# Patient Record
Sex: Female | Born: 1955 | Race: White | Hispanic: No | Marital: Married | Smoking: Never smoker
Health system: Southern US, Community
[De-identification: ages and names within clinical notes are randomized; demographics above are authoritative.]

## PROBLEM LIST (undated history)

## (undated) DIAGNOSIS — E1122 Type 2 diabetes mellitus with diabetic chronic kidney disease: Secondary | ICD-10-CM

## (undated) DIAGNOSIS — G35 Multiple sclerosis: Secondary | ICD-10-CM

## (undated) DIAGNOSIS — I129 Hypertensive chronic kidney disease with stage 1 through stage 4 chronic kidney disease, or unspecified chronic kidney disease: Secondary | ICD-10-CM

## (undated) DIAGNOSIS — I10 Essential (primary) hypertension: Secondary | ICD-10-CM

## (undated) DIAGNOSIS — E079 Disorder of thyroid, unspecified: Secondary | ICD-10-CM

## (undated) DIAGNOSIS — N182 Chronic kidney disease, stage 2 (mild): Secondary | ICD-10-CM

## (undated) DIAGNOSIS — E785 Hyperlipidemia, unspecified: Secondary | ICD-10-CM

## (undated) DIAGNOSIS — K529 Noninfective gastroenteritis and colitis, unspecified: Secondary | ICD-10-CM

## (undated) DIAGNOSIS — K219 Gastro-esophageal reflux disease without esophagitis: Secondary | ICD-10-CM

## (undated) DIAGNOSIS — E119 Type 2 diabetes mellitus without complications: Secondary | ICD-10-CM

## (undated) HISTORY — DX: Noninfective gastroenteritis and colitis, unspecified: K52.9

## (undated) HISTORY — DX: Gastro-esophageal reflux disease without esophagitis: K21.9

## (undated) HISTORY — DX: Hyperlipidemia, unspecified: E78.5

## (undated) HISTORY — PX: COLONOSCOPY: SHX174

## (undated) HISTORY — DX: Hypertensive chronic kidney disease with stage 1 through stage 4 chronic kidney disease, or unspecified chronic kidney disease: I12.9

## (undated) HISTORY — DX: Disorder of thyroid, unspecified: E07.9

## (undated) HISTORY — DX: Multiple sclerosis: G35

## (undated) HISTORY — DX: Chronic kidney disease, stage 2 (mild): N18.2

## (undated) HISTORY — DX: Essential (primary) hypertension: I10

## (undated) HISTORY — PX: WISDOM TOOTH EXTRACTION: SHX21

## (undated) HISTORY — DX: Type 2 diabetes mellitus with diabetic chronic kidney disease: E11.22

## (undated) HISTORY — DX: Type 2 diabetes mellitus without complications: E11.9

---

## 1997-06-23 ENCOUNTER — Other Ambulatory Visit: Admission: RE | Admit: 1997-06-23 | Discharge: 1997-06-23 | Payer: Self-pay | Admitting: Obstetrics & Gynecology

## 1997-08-16 ENCOUNTER — Other Ambulatory Visit: Admission: RE | Admit: 1997-08-16 | Discharge: 1997-08-16 | Payer: Self-pay | Admitting: *Deleted

## 1998-03-03 ENCOUNTER — Other Ambulatory Visit: Admission: RE | Admit: 1998-03-03 | Discharge: 1998-03-03 | Payer: Self-pay | Admitting: Obstetrics & Gynecology

## 1999-03-07 ENCOUNTER — Other Ambulatory Visit: Admission: RE | Admit: 1999-03-07 | Discharge: 1999-03-07 | Payer: Self-pay | Admitting: Obstetrics & Gynecology

## 2000-03-11 ENCOUNTER — Other Ambulatory Visit: Admission: RE | Admit: 2000-03-11 | Discharge: 2000-03-11 | Payer: Self-pay | Admitting: Obstetrics & Gynecology

## 2001-05-09 ENCOUNTER — Other Ambulatory Visit: Admission: RE | Admit: 2001-05-09 | Discharge: 2001-05-09 | Payer: Self-pay | Admitting: Obstetrics & Gynecology

## 2003-04-22 ENCOUNTER — Other Ambulatory Visit: Admission: RE | Admit: 2003-04-22 | Discharge: 2003-04-22 | Payer: Self-pay | Admitting: Obstetrics & Gynecology

## 2004-07-06 ENCOUNTER — Other Ambulatory Visit: Admission: RE | Admit: 2004-07-06 | Discharge: 2004-07-06 | Payer: Self-pay | Admitting: Obstetrics & Gynecology

## 2007-11-06 ENCOUNTER — Ambulatory Visit: Payer: Self-pay | Admitting: Internal Medicine

## 2007-11-21 ENCOUNTER — Ambulatory Visit: Payer: Self-pay | Admitting: Internal Medicine

## 2008-05-11 ENCOUNTER — Encounter: Payer: Self-pay | Admitting: Internal Medicine

## 2008-05-26 DIAGNOSIS — K921 Melena: Secondary | ICD-10-CM

## 2008-05-26 DIAGNOSIS — I10 Essential (primary) hypertension: Secondary | ICD-10-CM | POA: Insufficient documentation

## 2008-05-26 DIAGNOSIS — K449 Diaphragmatic hernia without obstruction or gangrene: Secondary | ICD-10-CM | POA: Insufficient documentation

## 2008-05-26 DIAGNOSIS — R079 Chest pain, unspecified: Secondary | ICD-10-CM | POA: Insufficient documentation

## 2008-05-26 DIAGNOSIS — G35 Multiple sclerosis: Secondary | ICD-10-CM

## 2008-05-26 DIAGNOSIS — K5909 Other constipation: Secondary | ICD-10-CM

## 2008-05-26 DIAGNOSIS — R109 Unspecified abdominal pain: Secondary | ICD-10-CM | POA: Insufficient documentation

## 2008-05-31 ENCOUNTER — Ambulatory Visit: Payer: Self-pay | Admitting: Internal Medicine

## 2008-06-07 ENCOUNTER — Ambulatory Visit (HOSPITAL_COMMUNITY): Admission: RE | Admit: 2008-06-07 | Discharge: 2008-06-07 | Payer: Self-pay | Admitting: Internal Medicine

## 2008-06-08 ENCOUNTER — Encounter: Payer: Self-pay | Admitting: Internal Medicine

## 2008-06-08 ENCOUNTER — Ambulatory Visit: Payer: Self-pay | Admitting: Internal Medicine

## 2008-06-09 ENCOUNTER — Encounter: Payer: Self-pay | Admitting: Internal Medicine

## 2008-08-02 ENCOUNTER — Encounter: Payer: Self-pay | Admitting: Internal Medicine

## 2010-11-13 ENCOUNTER — Other Ambulatory Visit: Payer: Self-pay | Admitting: Obstetrics & Gynecology

## 2012-08-19 ENCOUNTER — Telehealth: Payer: Self-pay | Admitting: *Deleted

## 2012-08-19 NOTE — Telephone Encounter (Signed)
Patient calling having some questions and concerns about getting live vaccines so that she can go out the country.  She wants to know if it is going to interfere with her meds that she getting from here.

## 2012-08-20 NOTE — Telephone Encounter (Signed)
So where is she going would decide which vaccines she should take,there is no contradiction that I am aware of with her current medications.

## 2012-09-02 ENCOUNTER — Telehealth: Payer: Self-pay

## 2012-09-02 NOTE — Telephone Encounter (Signed)
I called patient. The patient will be traveling to [place], and she needs a vaccination for yellow fever. There is no contraindication for getting a vaccination with the diagnosis of multiple sclerosis.

## 2012-09-02 NOTE — Telephone Encounter (Signed)
Patient called clinic and left message stating she is going to [place] and is concerned because she has MS.  She would like to know if there are any medications (or immunizations)  that should be prescribed before she goes.  Call back number [phone].  Thank you.

## 2012-09-19 ENCOUNTER — Encounter: Payer: Self-pay | Admitting: Nurse Practitioner

## 2012-09-19 ENCOUNTER — Ambulatory Visit (INDEPENDENT_AMBULATORY_CARE_PROVIDER_SITE_OTHER): Payer: PRIVATE HEALTH INSURANCE | Admitting: Nurse Practitioner

## 2012-09-19 VITALS — BP 122/78 | HR 75 | Ht 67.5 in | Wt 194.0 lb

## 2012-09-19 DIAGNOSIS — G35 Multiple sclerosis: Secondary | ICD-10-CM

## 2012-09-19 NOTE — Progress Notes (Signed)
HPI: Patient returns for followup after last visit 05/23/2012. She has a history of multiple sclerosis. She was on Betaseron but was tired of shots at her last visit and was switched to tecfidera. When she eats a full meal with the medication she has much less flushing and abdominal symptoms however if she eats a light meal her flushing is worse. Most recent MRI 05/29/2012 with previous MS lesions not seen.  She continues to have numbness which is intermittent on the top of the foot and she also has a pressure area over the third toe on the right with significant corns on the bottom of both feet. She denies any other sensory changes, focal weakness, speech or swallowing difficulty, double vision, blurred vision, balance issues   ROS:   swelling in legs, SOB, aching muscles,   Physical Exam General: well developed, well nourished, seated, in no evident distress Head: head normocephalic and atraumatic. Oropharynx benign Neck: supple with no carotid or supraclavicular bruits Cardiovascular: regular rate and rhythm, no murmurs Neurologic Exam Mental Status: Awake and fully alert. Oriented to place and time. Follows all commands. Mood and affect appropriate.  Cranial Nerves: Fundoscopic exam reveals sharp disc margins. Pupils equal, briskly reactive to light. Extraocular movements full without nystagmus. Visual fields full to confrontation. Hearing intact and symmetric to finger snap. Facial sensation intact. Face, tongue, palate move normally and symmetrically. Neck flexion and extension normal.  Motor: Normal bulk and tone. Normal strength in all tested extremity muscles. No focal weakness Sensory.: intact to touch and pinprick and vibratory in all extremities.  Coordination: Rapid alternating movements normal in all extremities. Finger-to-nose and heel-to-shin performed accurately bilaterally. Gait and Station: Arises from chair without difficulty. Stance is normal. Gait demonstrates normal stride  length and balance . Able to heel, toe and tandem walk without difficulty.  Reflexes: 2+ and symmetric. Toes downgoing.     ASSESSMENT: Multiple sclerosis currently on tecfidera     PLAN: Continue tecfidera her for now, she is to take the medication with a full meal twice a day If she needs to come off her tecfidera she wants to switch to something weekly. Reviewed CBC and CMP from primary care all within normal limits She will followup in 6 months  Nilda Riggs, GNP-BC APRN

## 2012-09-19 NOTE — Patient Instructions (Addendum)
Patient to continue tecfidera  for now Labs reviewed from primary care all within normal limits Followup in 6 months or sooner problems arise

## 2012-09-19 NOTE — Progress Notes (Signed)
I have read the note, and I agree with the clinical assessment and plan.  

## 2012-09-30 ENCOUNTER — Telehealth: Payer: Self-pay | Admitting: Nurse Practitioner

## 2012-09-30 NOTE — Telephone Encounter (Signed)
Patient is calling wanting to know if she can do something  other then Tecfidera. She knows it was her decision but does not want to do it any more. Cell # is  [phone]

## 2012-09-30 NOTE — Telephone Encounter (Signed)
TC to cell #, Unable to leave message. Called work #. Left message will switch to 1x week injection Avonex. Call back if she has concerns. A nursing visit will be ordered to teach new technique.

## 2013-01-16 ENCOUNTER — Other Ambulatory Visit (HOSPITAL_COMMUNITY): Payer: Self-pay | Admitting: Internal Medicine

## 2013-01-16 ENCOUNTER — Ambulatory Visit (HOSPITAL_COMMUNITY)
Admission: RE | Admit: 2013-01-16 | Discharge: 2013-01-16 | Disposition: A | Discharge status: Home / Self Care | Payer: PRIVATE HEALTH INSURANCE | Source: Ambulatory Visit | Attending: Internal Medicine | Admitting: Internal Medicine

## 2013-01-16 DIAGNOSIS — R52 Pain, unspecified: Secondary | ICD-10-CM

## 2013-01-16 DIAGNOSIS — R109 Unspecified abdominal pain: Secondary | ICD-10-CM | POA: Insufficient documentation

## 2013-01-16 DIAGNOSIS — Q619 Cystic kidney disease, unspecified: Secondary | ICD-10-CM | POA: Insufficient documentation

## 2013-01-27 ENCOUNTER — Other Ambulatory Visit: Payer: Self-pay | Admitting: Gastroenterology

## 2013-01-27 DIAGNOSIS — R110 Nausea: Secondary | ICD-10-CM

## 2013-01-27 DIAGNOSIS — R1013 Epigastric pain: Secondary | ICD-10-CM

## 2013-02-12 ENCOUNTER — Encounter (HOSPITAL_COMMUNITY)
Admission: RE | Admit: 2013-02-12 | Discharge: 2013-02-12 | Disposition: A | Discharge status: Home / Self Care | Payer: PRIVATE HEALTH INSURANCE | Source: Ambulatory Visit | Attending: Gastroenterology | Admitting: Gastroenterology

## 2013-02-12 DIAGNOSIS — R110 Nausea: Secondary | ICD-10-CM

## 2013-02-12 DIAGNOSIS — R1013 Epigastric pain: Secondary | ICD-10-CM

## 2013-02-12 MED ORDER — TECHNETIUM TC 99M MEBROFENIN IV KIT
5.0000 | PACK | Freq: Once | INTRAVENOUS | Status: AC | PRN
  Administered 2013-02-12: 5 via INTRAVENOUS

## 2013-02-12 MED ORDER — SINCALIDE 5 MCG IJ SOLR
INTRAMUSCULAR | Status: AC
  Administered 2013-02-12: 1.76 ug via INTRAVENOUS
  Filled 2013-02-12: qty 5

## 2013-02-12 MED ORDER — SINCALIDE 5 MCG IJ SOLR
0.0200 ug/kg | Freq: Once | INTRAMUSCULAR | Status: AC
  Administered 2013-02-12: 1.76 ug via INTRAVENOUS

## 2013-03-12 ENCOUNTER — Other Ambulatory Visit: Payer: Self-pay

## 2013-03-23 ENCOUNTER — Encounter: Payer: Self-pay | Admitting: Nurse Practitioner

## 2013-03-23 ENCOUNTER — Ambulatory Visit (INDEPENDENT_AMBULATORY_CARE_PROVIDER_SITE_OTHER): Payer: PRIVATE HEALTH INSURANCE | Admitting: Nurse Practitioner

## 2013-03-23 VITALS — BP 134/79 | HR 76 | Ht 62.5 in | Wt 197.0 lb

## 2013-03-23 DIAGNOSIS — G35 Multiple sclerosis: Secondary | ICD-10-CM

## 2013-03-23 NOTE — Progress Notes (Signed)
[facility]  PATIENT: Christina Carey DOB: 06-18-1955   REASON FOR VISIT: Followup for multiple sclerosis   HISTORY OF PRESENT ILLNESS: Christina Carey, 57 year old white female returns for followup. She was last seen 09/19/2012. When last seen she was on tecfidera however she called and wanted to be switched to a weekly injectable on 09/30/12.  A prescription was sent in but patient claims she was never called to initiate the therapy. She continues to have numbness which is intermittent on the top of her foot , she also has pressure over the third toe on the right with significant corns on the bottom of both feet. She denies any double vision blurred vision falls weakness speech or swallowing difficulties. She does not see the need to be on the medication at this time since she feels so good.Last MRI of the brain 05/29/12.   HISTORY: She has a history of multiple sclerosis. She was on Betaseron but was tired of shots at her last visit and was switched to tecfidera. When she eats a full meal with the medication she has much less flushing and abdominal symptoms however if she eats a light meal her flushing is worse. Most recent MRI 05/29/2012 with previous MS lesions not seen. She continues to have numbness which is intermittent on the top of the foot and she also has a pressure area over the third toe on the right with significant corns on the bottom of both feet. She denies any other sensory changes, focal weakness, speech or swallowing difficulty, double vision, blurred vision, balance issues    REVIEW OF SYSTEMS: Full 14 system review of systems performed and notable only for:  Constitutional: N/A  Cardiovascular: N/A  Ear/Nose/Throat: N/A  Skin: N/A  Eyes: N/A  Respiratory: N/A  Gastroitestinal: N/A  Hematology/Lymphatic: N/A  Endocrine: N/A Musculoskeletal:N/A  Allergy/Immunology: N/A  Neurological: numbness Psychiatric: N/A   ALLERGIES: Allergies  Allergen  Reactions  . Sulfamethoxazole-Trimethoprim     REACTION: rash    HOME MEDICATIONS: Outpatient Prescriptions Prior to Visit  Medication Sig Dispense Refill  . Cinnamon 500 MG TABS Take 500 mg by mouth 2 (two) times daily.      Marland Kitchen levothyroxine (SYNTHROID, LEVOTHROID) 100 MCG tablet       . simvastatin (ZOCOR) 5 MG tablet       . trandolapril (MAVIK) 2 MG tablet Take 2 mg by mouth daily.      Marland Kitchen atovaquone-proguanil (MALARONE) 250-100 MG TABS       . TECFIDERA 240 MG CPDR        No facility-administered medications prior to visit.    PAST MEDICAL HISTORY: Past Medical History  Diagnosis Date  . High blood pressure   . Thyroid disorder   . MS (multiple sclerosis)     PAST SURGICAL HISTORY: History reviewed. No pertinent past surgical history.  FAMILY HISTORY: Family History  Problem Relation Age of Onset  . Cancer Mother   . Cancer Father   . Cancer Maternal Grandmother     SOCIAL HISTORY: History   Social History  . Marital Status: Married    Spouse Name: Christina Carey    Number of Children: 2  . Years of Education: 16   Occupational History  . ACCOUNTING    Social History Main Topics  . Smoking status: Never Smoker   . Smokeless tobacco: Never Used  . Alcohol Use: No     Comment: caffeine drinks 1-2 daily  . Drug Use: No  . Sexual Activity:  Not on file   Other Topics Concern  . Not on file   Social History Narrative   Patient lives at home with her husband Christina Carey. and they have 2 adult children. 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    Patient is right- handed.   Patient drinks one cup of coffee daily and soda very rarely.     PHYSICAL EXAM  Filed Vitals:   03/23/13 0835  BP: 134/79  Pulse: 76  Height: 5' 2.5" (1.588 m)  Weight: 197 lb (89.359 kg)   Body mass index is 35.44 kg/(m^2).  Generalized: Well developed, obese female in no acute distress  Head: normocephalic and atraumatic,. Oropharynx benign  Neck:  Supple, no carotid bruits  Cardiac: Regular rate rhythm, no murmur  Musculoskeletal: No deformity   Neurological examination   Mentation: Alert oriented to time, place, history taking. Follows all commands speech and language fluent  Cranial nerve II-XII: Visual acuity 20/30left, 20/40right.  Pupils were equal round reactive to light extraocular movements were full, visual field were full on confrontational test. Facial sensation and strength were normal. hearing was intact to finger rubbing bilaterally. Uvula tongue midline. head turning and shoulder shrug and were normal and symmetric.Tongue protrusion into cheek strength was normal. Motor: normal bulk and tone, full strength in the BUE, BLE, fine finger movements normal, no pronator drift. No focal weakness Sensory: normal and symmetric to light touch, pinprick, and  vibration  Coordination: finger-nose-finger, heel-to-shin bilaterally, no dysmetria Reflexes: Brachioradialis 2/2, biceps 2/2, triceps 2/2, patellar 2/2, Achilles 2/2, plantar responses were flexor bilaterally. Gait and Station: Rising up from seated position without assistance, normal stance, moderate stride, good arm swing, smooth turning, able to perform tiptoe, and heel walking without difficulty.   DIAGNOSTIC DATA (LABS, IMAGING, TESTING) -None to review  ASSESSMENT AND PLAN  57 y.o. year old female  has a past medical history of High blood pressure; Thyroid disorder; and MS (multiple sclerosis). here to followup. She was on tecfidera when last seen however she stopped the drug and asked to be placed on Avonex. She claims no one ever called her to initiate this.  Discussed Plegridy. Patient says I am not interested in medication that has just come on the market Discussed the reasoning behind being on some type of medication for her MS Forms for Avonex filled out, given the number that she will be contacted from with Avonex F/U in 6 months.  Check labs next visit. Vst  time 30 min Nilda Riggs, [facility], [facility], [place], APRN  [facility] [address] [place], [place] [address] [phone]

## 2013-03-23 NOTE — Progress Notes (Signed)
I have read the note, and I agree with the clinical assessment and plan.  WILLIS,CHARLES KEITH   

## 2013-03-23 NOTE — Patient Instructions (Signed)
Forms for Avonex filled out F/U in 6 months.  Check labs next visit

## 2013-05-11 ENCOUNTER — Other Ambulatory Visit: Payer: Self-pay | Admitting: Obstetrics & Gynecology

## 2013-09-21 ENCOUNTER — Ambulatory Visit (INDEPENDENT_AMBULATORY_CARE_PROVIDER_SITE_OTHER): Payer: PRIVATE HEALTH INSURANCE | Admitting: Nurse Practitioner

## 2013-09-21 ENCOUNTER — Encounter: Payer: Self-pay | Admitting: Nurse Practitioner

## 2013-09-21 VITALS — BP 124/80 | HR 66 | Ht 67.5 in | Wt 202.0 lb

## 2013-09-21 DIAGNOSIS — G35 Multiple sclerosis: Secondary | ICD-10-CM

## 2013-09-21 NOTE — Progress Notes (Signed)
I have read the note, and I agree with the clinical assessment and plan.  Charles K Willis   

## 2013-09-21 NOTE — Progress Notes (Signed)
[facility]  PATIENT: MEEKA CARTELLI DOB: October 17, 1955   REASON FOR VISIT: Followup for multiple sclerosis   HISTORY OF PRESENT ILLNESS: Ms. Slaby, 58 year old female returns for followup. When last seen 03/23/2013 she was not on any medication having stopped tecfidera. She was off of medication for about 4-5 months.  She continues to have numbness which is intermittent on the top of her foot , she also has pressure over the third toe on the right with significant corns on the bottom of both feet. She denies any double vision blurred vision,  Falls, weakness, speech or swallowing difficulties. Last MRI of the brain 05/29/12. She has been on Avonex for approximately 5 months. She is due to get routine labs at primary care tomorrow. She returns for reevaluation HISTORY: She has a history of multiple sclerosis. She was on Betaseron but was tired of shots at her last visit and was switched to tecfidera. When she eats a full meal with the medication she has much less flushing and abdominal symptoms however if she eats a light meal her flushing is worse. Most recent MRI 05/29/2012 with previous MS lesions not seen. She continues to have numbness which is intermittent on the top of the foot and she also has a pressure area over the third toe on the right with significant corns on the bottom of both feet. She denies any other sensory changes, focal weakness, speech or swallowing difficulty, double vision, blurred vision, balance issues   REVIEW OF SYSTEMS: Full 14 system review of systems performed and notable only for those listed, all others are neg:  Constitutional: N/A  Cardiovascular: N/A  Ear/Nose/Throat: N/A  Skin: N/A  Eyes: N/A  Respiratory: N/A  Gastroitestinal: Abdominal pain Hematology/Lymphatic: N/A  Endocrine: N/A Musculoskeletal:N/A  Allergy/Immunology: N/A  Neurological: Numbness Psychiatric: N/A Sleep : NA   ALLERGIES: Allergies  Allergen Reactions  .  Sulfamethoxazole-Trimethoprim     REACTION: rash    HOME MEDICATIONS: Outpatient Prescriptions Prior to Visit  Medication Sig Dispense Refill  . amLODipine (NORVASC) 5 MG tablet 5 mg daily.      . Calcium Carbonate-Vitamin D (CALCIUM + D PO) Take 600 mg by mouth daily.      . Cinnamon 500 MG TABS Take 500 mg by mouth 2 (two) times daily.      Marland Kitchen levothyroxine (SYNTHROID, LEVOTHROID) 100 MCG tablet       . simvastatin (ZOCOR) 5 MG tablet       . trandolapril (MAVIK) 2 MG tablet Take 2 mg by mouth daily.       No facility-administered medications prior to visit.    PAST MEDICAL HISTORY: Past Medical History  Diagnosis Date  . High blood pressure   . Thyroid disorder   . MS (multiple sclerosis)     PAST SURGICAL HISTORY: History reviewed. No pertinent past surgical history.  FAMILY HISTORY: Family History  Problem Relation Age of Onset  . Cancer Mother   . Cancer Father   . Cancer Maternal Grandmother     SOCIAL HISTORY: History   Social History  . Marital Status: Married    Spouse Name: Purcell Nails    Number of Children: 2  . Years of Education: 16   Occupational History  . ACCOUNTING    Social History Main Topics  . Smoking status: Never Smoker   . Smokeless tobacco: Never Used  . Alcohol Use: No     Comment: caffeine drinks 1-2 daily  . Drug Use: No  .  Sexual Activity: Not on file   Other Topics Concern  . Not on file   Social History Narrative   Patient lives at home with her husband Farrel Gordon. and they have 2 adult children. 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    Patient is right- handed.   Patient drinks one cup of coffee daily and soda very rarely.     PHYSICAL EXAM  Filed Vitals:   09/21/13 0909  BP: 124/80  Pulse: 66  Height: 5' 7.5" (1.715 m)  Weight: 202 lb (91.627 kg)   Body mass index is 31.15 kg/(m^2).  Generalized: Well developed, in no acute distress  Head: normocephalic and atraumatic,. Oropharynx  benign  Neck: Supple, no carotid bruits  Cardiac: Regular rate rhythm, no murmur  Musculoskeletal: No deformity   Neurological examination   Mentation: Alert oriented to time, place, history taking. Follows all commands speech and language fluent  Cranial nerve II-XII: Visual acuity 20/20 right, 20/30 left. Fundoscopic exam reveals sharp disc margins.Pupils were equal round reactive to light extraocular movements were full, visual field were full on confrontational test. Facial sensation and strength were normal. hearing was intact to finger rubbing bilaterally. Uvula tongue midline. head turning and shoulder shrug were normal and symmetric.Tongue protrusion into cheek strength was normal. Motor: normal bulk and tone, full strength in the BUE, BLE,  No focal weakness Sensory: normal and symmetric to light touch, pinprick, and  vibration  Coordination: finger-nose-finger, heel-to-shin bilaterally, no dysmetria Reflexes: Brachioradialis 2/2, biceps 2/2, triceps 2/2, patellar 2/2, Achilles 2/2, plantar responses were flexor bilaterally. Gait and Station: Rising up from seated position without assistance, normal stance,  moderate stride, good arm swing, smooth turning, able to perform tiptoe, and heel walking without difficulty. Tandem gait is steady  DIAGNOSTIC DATA (LABS, IMAGING, TESTING) -    ASSESSMENT AND PLAN  58 y.o. year old female  has a past medical history of High blood pressure; Thyroid disorder; and MS (multiple sclerosis). here to followup. She is back on immunomodulating therapy for her MS. She is taking Avonex IM weekly. She has been  on the medication for about 5 months  Continue Avonex weekly CBC and CMP to be done at  primary cares office tomorrow Followup in 6 months Will repeat MRI of the brain at next visit Dennie Bible, [facility], [facility], [facility] [address] [place], [place] [address] [phone]

## 2013-09-21 NOTE — Patient Instructions (Signed)
Continue Avonex weekly CBC and CMP to be done at  primary cares office tomorrow Followup in 6 months

## 2013-10-21 ENCOUNTER — Telehealth: Payer: Self-pay | Admitting: Nurse Practitioner

## 2013-10-21 NOTE — Telephone Encounter (Signed)
Info has already been faxed back.  I returned call, got no answer.  Left message.

## 2013-10-21 NOTE — Telephone Encounter (Signed)
Christina Carey w/[employer] called needs a AVONEX PEN 30 MCG/0.5ML injection prescription request. Darci Needle states she has faxed over the request 2 times at [phone], wants to know if we received this request. Please call Christina Carey back at [phone] Ext. [phone]. Thanks

## 2014-02-19 ENCOUNTER — Other Ambulatory Visit: Payer: Self-pay

## 2014-03-24 ENCOUNTER — Ambulatory Visit: Payer: PRIVATE HEALTH INSURANCE | Admitting: Nurse Practitioner

## 2014-05-07 DIAGNOSIS — I129 Hypertensive chronic kidney disease with stage 1 through stage 4 chronic kidney disease, or unspecified chronic kidney disease: Secondary | ICD-10-CM

## 2014-05-07 HISTORY — DX: Hypertensive chronic kidney disease with stage 1 through stage 4 chronic kidney disease, or unspecified chronic kidney disease: I12.9

## 2014-05-17 ENCOUNTER — Other Ambulatory Visit: Payer: Self-pay | Admitting: Obstetrics & Gynecology

## 2014-05-18 LAB — CYTOLOGY - PAP

## 2014-05-31 ENCOUNTER — Encounter: Payer: Self-pay | Admitting: Nurse Practitioner

## 2014-05-31 ENCOUNTER — Ambulatory Visit (INDEPENDENT_AMBULATORY_CARE_PROVIDER_SITE_OTHER): Payer: 59 | Admitting: Nurse Practitioner

## 2014-05-31 VITALS — BP 136/92 | HR 78 | Ht 67.5 in | Wt 203.0 lb

## 2014-05-31 DIAGNOSIS — G35 Multiple sclerosis: Secondary | ICD-10-CM

## 2014-05-31 NOTE — Patient Instructions (Signed)
Continue Avonex weekly Repeat MRI of the brain and compare to 05/29/2012 Liver function done through primary care physician Follow-up in 6-8 months

## 2014-05-31 NOTE — Progress Notes (Signed)
[facility]  PATIENT: Christina Carey DOB: 09/10/1955   REASON FOR VISIT: follow up for MS HISTORY FROM:patient    HISTORY OF PRESENT ILLNESS:Christina Carey, 59 year old female returns for followup. She was last  seen 09/21/13, and at that time she had been off of her tecfidera 3-4 months. She was placed on Avonex weekly IM and has been on this drug for approximately a year. She denies any double vision blurred vision, falls, weakness, speech or swallowing difficulties. Last MRI of the brain 05/29/12. She obtains  routine labs at primary care to include liver function. She returns for reevaluation. She is due for repeat MRI of the brain and compare to previous 05/29/2012. She returns for reevaluation HISTORY: She has a history of multiple sclerosis. She was on Betaseron but was tired of shots at her last visit and was switched to tecfidera. When she eats a full meal with the medication she has much less flushing and abdominal symptoms however if she eats a light meal her flushing is worse. Most recent MRI 05/29/2012 with previous MS lesions not seen. She continues to have numbness which is intermittent on the top of the foot and she also has a pressure area over the third toe on the right with significant corns on the bottom of both feet. She denies any other sensory changes, focal weakness, speech or swallowing difficulty, double vision, blurred vision, balance issues  REVIEW OF SYSTEMS: Full 14 system review of systems performed and notable only for those listed, all others are neg:  Constitutional: neg  Cardiovascular: neg Ear/Nose/Throat: neg  Skin: neg Eyes: neg Respiratory: neg Gastroitestinal: neg  Hematology/Lymphatic: neg  Endocrine: neg Musculoskeletal:neg Allergy/Immunology: neg Neurological: neg Psychiatric: neg Sleep : neg   ALLERGIES: Allergies  Allergen Reactions  . Sulfamethoxazole-Trimethoprim     REACTION: rash    HOME  MEDICATIONS: Outpatient Prescriptions Prior to Visit  Medication Sig Dispense Refill  . amLODipine (NORVASC) 5 MG tablet 5 mg daily.    . AVONEX PEN 30 MCG/0.5ML injection     . Calcium Carbonate-Vitamin D (CALCIUM + D PO) Take 600 mg by mouth daily.    . Cinnamon 500 MG TABS Take 500 mg by mouth 2 (two) times daily.    . simvastatin (ZOCOR) 5 MG tablet     . trandolapril (MAVIK) 2 MG tablet Take 2 mg by mouth daily.    . chlorpheniramine-HYDROcodone (TUSSIONEX) 10-8 MG/5ML LQCR     . levothyroxine (SYNTHROID, LEVOTHROID) 100 MCG tablet      No facility-administered medications prior to visit.    PAST MEDICAL HISTORY: Past Medical History  Diagnosis Date  . High blood pressure   . Thyroid disorder   . MS (multiple sclerosis)     PAST SURGICAL HISTORY: History reviewed. No pertinent past surgical history.  FAMILY HISTORY: Family History  Problem Relation Age of Onset  . Cancer Mother   . Cancer Father   . Cancer Maternal Grandmother     SOCIAL HISTORY: History   Social History  . Marital Status: Married    Spouse Name: Purcell Nails    Number of Children: 2  . Years of Education: 16   Occupational History  . ACCOUNTING    Social History Main Topics  . Smoking status: Never Smoker   . Smokeless tobacco: Never Used  . Alcohol Use: No     Comment: caffeine drinks 1-2 daily  . Drug Use: No  . Sexual Activity: Not on file   Other Topics Concern  .  Not on file   Social History Narrative   Patient lives at home with her husband Farrel Gordon. and they have 2 adult children. 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    Patient is right- handed.   Patient drinks one cup of coffee daily and soda very rarely.     PHYSICAL EXAM  Filed Vitals:   05/31/14 1448 05/31/14 1454  BP: 139/90 136/92  Pulse: 81 78  Height: 5' 7.5" (1.715 m)   Weight: 203 lb (92.08 kg)    Body mass index is 31.31 kg/(m^2). Generalized: Well developed, in no acute  distress  Head: normocephalic and atraumatic,. Oropharynx benign  Neck: Supple, no carotid bruits  Cardiac: Regular rate rhythm, no murmur  Musculoskeletal: No deformity   Neurological examination   Mentation: Alert oriented to time, place, history taking. Follows all commands speech and language fluent  Cranial nerve II-XII:  Fundoscopic exam reveals sharp disc margins.Pupils were equal round reactive to light extraocular movements were full, visual field were full on confrontational test. Facial sensation and strength were normal. hearing was intact to finger rubbing bilaterally. Uvula tongue midline. head turning and shoulder shrug were normal and symmetric.Tongue protrusion into cheek strength was normal. Motor: normal bulk and tone, full strength in the BUE, BLE, No focal weakness Sensory: normal and symmetric to light touch, pinprick, and vibration  Coordination: finger-nose-finger, heel-to-shin bilaterally, no dysmetria Reflexes: Brachioradialis 2/2, biceps 2/2, triceps 2/2, patellar 2/2, Achilles 2/2, plantar responses were flexor bilaterally. Gait and Station: Rising up from seated position without assistance, normal stance, moderate stride, good arm swing, smooth turning, able to perform tiptoe, and heel walking without difficulty. Tandem gait is steady  DIAGNOSTIC DATA (LABS, IMAGING, TESTING) ASSESSMENT AND PLAN  59 y.o. year old female  has a past medical history of High blood pressure; Thyroid disorder; and MS (multiple sclerosis). here to follow-up. She is currently on Avonex weekly IM without exacerbation of her MS symptoms. She is due for repeat MRI of the brain and compare to 05/29/2012.   Continue Avonex weekly Repeat MRI of the brain and compare to 05/29/2012 Liver function done through primary care physician Follow-up in 6-8 months Dennie Bible, [facility], [facility] [address] [place], [place] [address] [phone]

## 2014-05-31 NOTE — Progress Notes (Signed)
I have read the note, and I agree with the clinical assessment and plan.  WILLIS,CHARLES KEITH   

## 2014-06-23 ENCOUNTER — Other Ambulatory Visit: Payer: Self-pay | Admitting: Nurse Practitioner

## 2014-06-28 ENCOUNTER — Ambulatory Visit
Admission: RE | Admit: 2014-06-28 | Discharge: 2014-06-28 | Disposition: A | Discharge status: Home / Self Care | Payer: PRIVATE HEALTH INSURANCE | Source: Ambulatory Visit | Attending: Nurse Practitioner | Admitting: Nurse Practitioner

## 2014-06-28 DIAGNOSIS — G35 Multiple sclerosis: Secondary | ICD-10-CM

## 2014-06-28 MED ORDER — GADOBENATE DIMEGLUMINE 529 MG/ML IV SOLN
19.0000 mL | Freq: Once | INTRAVENOUS | Status: AC | PRN
  Administered 2014-06-28: 19 mL via INTRAVENOUS

## 2014-07-01 ENCOUNTER — Telehealth: Payer: Self-pay | Admitting: Nurse Practitioner

## 2014-07-01 NOTE — Telephone Encounter (Signed)
Patient is calling because [employer] is saying the Rx for Avonex Pen 43mcg IM weekly needs prior authorization before Rx can be filled. Patient states [employer]'s # is [phone]. Thank you.

## 2014-07-01 NOTE — Telephone Encounter (Signed)
I contacted [employer] and provided all clinical info.  Request is currently under review.

## 2014-07-07 ENCOUNTER — Telehealth: Payer: Self-pay | Admitting: Nurse Practitioner

## 2014-07-07 ENCOUNTER — Telehealth: Payer: Self-pay

## 2014-07-07 NOTE — Telephone Encounter (Signed)
I called [employer] at [phone].  Spoke with [employer].  Explained the situation with ins.  She said the patient would need to contact them to be screened for the assistance program, as we are not allowed to initiate this for her.  I called the patient.  Relayed the info provided by [place].  She will contact them and call us back if anything further is needed.

## 2014-07-07 NOTE — Telephone Encounter (Signed)
[facility] has denied the request for coverage on Avonex.  I called them at [phone].  Spoke with Mikeal Hawthorne, who was not able to assist me and transferred me to [facility].  He indicated that Avonex would not be covered as it is "excluded from the plan, and no coverage review is available".  He stated that the following medications are preferred on the formulary: Tecfidera, Rebif, Copaxone, Aubagio, Betaseron and Gilenya.  Would you like to change to a preferred alternative?  Please advise.  Thank you.

## 2014-07-07 NOTE — Telephone Encounter (Signed)
Can the Avonex rep intervene. She has done well on this and no exacerbations

## 2014-07-07 NOTE — Telephone Encounter (Signed)
Patient stated she spoke with [employer] and they informed her that insurance will not cover Rx Avonex Pen 30 mcg IM weekly.  Please call and advise.

## 2014-07-07 NOTE — Telephone Encounter (Signed)
Message sent to provider regarding Ins response.

## 2014-07-15 ENCOUNTER — Telehealth: Payer: Self-pay | Admitting: Nurse Practitioner

## 2014-07-15 NOTE — Telephone Encounter (Signed)
[facility] [employer] is calling stating AVONEX PEN 30 MCG/0.5ML AJKT is no longer covered, they will cover Aubagio, Beta Serum, Copaxone, Gilenya, Rebif and Tecfidera.  Medication will have to changed to one of them to be covered.  Please advise.

## 2014-07-15 NOTE — Telephone Encounter (Signed)
Please see previous note.  Provider does not wish to change meds, and patient has contacted [employer] to be screened for the assistance program. I called back.  Spoke with Beverely Low.  He will note the account.

## 2014-07-26 ENCOUNTER — Telehealth: Payer: Self-pay | Admitting: Diagnostic Neuroimaging

## 2014-07-26 NOTE — Telephone Encounter (Signed)
Please see previous 2 notes. Provider does not wish to change meds, and patient has contacted [employer] to be screened for the assistance program.  I called back again.  Spoke with [employer].  She placed me on hold for 15 minutes then transferred me to [place] who said he did not know why she transferred me, and nothing further is needed.

## 2014-07-26 NOTE — Telephone Encounter (Signed)
[facility] is asking for [place] to get authorization for Avonex Pen 30MCG/.05 for patient. Phone # for authorization on patient's card is [phone]. Please let [employer] know if we are able to authorize Rx. Thanks!

## 2014-12-31 ENCOUNTER — Ambulatory Visit: Payer: 59 | Admitting: Nurse Practitioner

## 2015-01-03 ENCOUNTER — Ambulatory Visit: Payer: 59 | Admitting: Nurse Practitioner

## 2015-01-03 ENCOUNTER — Telehealth: Payer: Self-pay | Admitting: *Deleted

## 2015-01-03 DIAGNOSIS — Z0289 Encounter for other administrative examinations: Secondary | ICD-10-CM

## 2015-01-03 NOTE — Telephone Encounter (Signed)
Pt called and let us know that she tried to call and cancel her appt at 0730.  She did not reschedule.

## 2015-01-05 ENCOUNTER — Encounter: Payer: Self-pay | Admitting: Nurse Practitioner

## 2015-01-05 ENCOUNTER — Ambulatory Visit (INDEPENDENT_AMBULATORY_CARE_PROVIDER_SITE_OTHER): Payer: 59 | Admitting: Nurse Practitioner

## 2015-01-05 VITALS — BP 126/82 | HR 81 | Ht 67.0 in | Wt 204.2 lb

## 2015-01-05 DIAGNOSIS — Z5181 Encounter for therapeutic drug level monitoring: Secondary | ICD-10-CM | POA: Insufficient documentation

## 2015-01-05 DIAGNOSIS — G35 Multiple sclerosis: Secondary | ICD-10-CM

## 2015-01-05 NOTE — Progress Notes (Signed)
I have read the note, and I agree with the clinical assessment and plan.  Amire Gossen KEITH   

## 2015-01-05 NOTE — Progress Notes (Signed)
[facility]  PATIENT: Christina Carey DOB: [birth year]   REASON FOR VISIT: Follow-up for relapsing remitting multiple sclerosis HISTORY FROM: Patient    HISTORY OF PRESENT ILLNESS:Christina Carey, 59 year old female returns for followup. She was last seen 05/31/14, and was  on Avonex at that time after having side effects to tecfidera. She denies any double vision blurred vision, falls, weakness, speech or swallowing difficulties. Last MRI of the brain 06/28/14 , impression: abnormal MRI of the brain with and without contrast showing foci within the white matter in a pattern and configuration consistent with the clinical diagnosis of multiple sclerosis. None of the foci appears to be acute.She obtains routine labs at primary care to include liver function. She returns for reevaluation.     HISTORY: She has a history of multiple sclerosis. She was on Betaseron but was tired of shots at her last visit and was switched to tecfidera. When she eats a full meal with the medication she has much less flushing and abdominal symptoms however if she eats a light meal her flushing is worse. Most recent MRI 05/29/2012 with previous MS lesions not seen. She continues to have numbness which is intermittent on the top of the foot and she also has a pressure area over the third toe on the right with significant corns on the bottom of both feet. She denies any other sensory changes, focal weakness, speech or swallowing difficulty, double vision, blurred vision, balance issues   REVIEW OF SYSTEMS: Full 14 system review of systems performed and notable only for those listed, all others are neg:  Constitutional: neg  Cardiovascular: neg Ear/Nose/Throat: neg  Skin: neg Eyes: Dryness Respiratory: neg Gastroitestinal: neg  Hematology/Lymphatic: neg  Endocrine: neg Musculoskeletal: Joint pain Allergy/Immunology: neg Neurological: neg Psychiatric: neg Sleep : neg   ALLERGIES: Allergies    Allergen Reactions  . Sulfamethoxazole-Trimethoprim     REACTION: rash, taking trimethoprim now and is not having a problem. 01-05-15.    HOME MEDICATIONS: Outpatient Prescriptions Prior to Visit  Medication Sig Dispense Refill  . amLODipine (NORVASC) 5 MG tablet 5 mg daily.    . AVONEX PEN 30 MCG/0.5ML AJKT INJECT 30MCG IM WEEKLY:HOLD PEN FIRMLY AGAINST THIGH AND PRESS BLUE BUTTON. HOLD IN PLACE FOR 10 SECONDS. REFRIGERATE. ALLOW TO WARM TO ROOM 1 each 6  . Calcium Carbonate-Vitamin D (CALCIUM + D PO) Take 600 mg by mouth daily.    . Cinnamon 500 MG TABS Take 500 mg by mouth 2 (two) times daily.    . nitrofurantoin, macrocrystal-monohydrate, (MACROBID) 100 MG capsule Take 100 mg by mouth 2 (two) times daily.  1  . nystatin-triamcinolone (MYCOLOG II) cream   1  . simvastatin (ZOCOR) 5 MG tablet     . trandolapril (MAVIK) 4 MG tablet Take 4 mg by mouth daily.  1  . AVONEX PEN 30 MCG/0.5ML injection     . chlorpheniramine-HYDROcodone (TUSSIONEX) 10-8 MG/5ML LQCR     . levothyroxine (SYNTHROID, LEVOTHROID) 100 MCG tablet   5   No facility-administered medications prior to visit.    PAST MEDICAL HISTORY: Past Medical History  Diagnosis Date  . High blood pressure   . Thyroid disorder   . MS (multiple sclerosis)     PAST SURGICAL HISTORY: History reviewed. No pertinent past surgical history.  FAMILY HISTORY: Family History  Problem Relation Age of Onset  . Cancer Mother   . Cancer Father   . Cancer Maternal Grandmother     SOCIAL HISTORY: Social History  Social History  . Marital Status: Married    Spouse Name: Christina Carey  . Number of Children: 2  . Years of Education: 16   Occupational History  . ACCOUNTING    Social History Main Topics  . Smoking status: Never Smoker   . Smokeless tobacco: Never Used  . Alcohol Use: No     Comment: caffeine drinks 1-2 daily  . Drug Use: No  . Sexual Activity: Not on file   Other Topics Concern  . Not on file   Social  History Narrative   Patient lives at home with her husband Christina Carey. and they have 2 adult children. 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    Patient is right- handed.   Patient drinks one cup of coffee daily and soda very rarely.     PHYSICAL EXAM  Filed Vitals:   01/05/15 0747  BP: 126/82  Pulse: 81  Height: 5\' 7"  (1.702 m)  Weight: 204 lb 3.2 oz (92.625 kg)   Body mass index is 31.97 kg/(m^2). Generalized: Well developed, in no acute distress  Head: normocephalic and atraumatic,. Oropharynx benign  Neck: Supple, no carotid bruits  Cardiac: Regular rate rhythm, no murmur  Musculoskeletal: No deformity   Neurological examination   Mentation: Alert oriented to time, place, history taking. Follows all commands speech and language fluent  Cranial nerve II-XII: Fundoscopic exam reveals sharp disc margins.Pupils were equal round reactive to light extraocular movements were full, visual field were full on confrontational test. Facial sensation and strength were normal. hearing was intact to finger rubbing bilaterally. Uvula tongue midline. head turning and shoulder shrug were normal and symmetric.Tongue protrusion into cheek strength was normal. Motor: normal bulk and tone, full strength in the BUE, BLE, No focal weakness Sensory: normal and symmetric to light touch, pinprick, and vibration  Coordination: finger-nose-finger, heel-to-shin bilaterally, no dysmetria Reflexes: Brachioradialis 2/2, biceps 2/2, triceps 2/2, patellar 2/2, Achilles 2/2, plantar responses were flexor bilaterally. Gait and Station: Rising up from seated position without assistance, normal stance, moderate stride, good arm swing, smooth turning, able to perform tiptoe, and heel walking without difficulty. Tandem gait is steady   DIAGNOSTIC DATA (LABS, IMAGING, TESTING) - ASSESSMENT AND PLAN 59 y.o. year old female  has a past medical history of High blood pressure; Thyroid  disorder; and MS (multiple sclerosis). here to follow-up. She is currently on Avonex weekly IM without exacerbation of her MS symptoms.   MRI of the brain 06/28/14 This is an abnormal MRI of the brain with and without contrast showing foci within the white matter in a pattern and configuration consistent with the clinical diagnosis of multiple sclerosis. None of the foci appears to be acute.   Continue Avonex weekly patient obtained through patient assistance Labs followed through PCP Dr. Noah Delaine F/U in 6 months  Dennie Bible, [facility], [facility], [facility] [address], [place], [place] [address] [phone]

## 2015-01-05 NOTE — Patient Instructions (Signed)
Continue Avonex weekly Labs followed through PCP Dr. Noah Delaine Call for exacerbation of symptoms F/U in 6 months

## 2015-04-25 ENCOUNTER — Telehealth: Payer: Self-pay | Admitting: Neurology

## 2015-04-25 NOTE — Telephone Encounter (Signed)
Blood work from primary care physician reveals a magnesium level of 2.0. White count of 3.9, hemoglobin of 13.4, platelets of 276. ALT and AST are normal. Hemoglobin A1c of 6.0 TSH is 2.28

## 2015-06-08 DIAGNOSIS — K529 Noninfective gastroenteritis and colitis, unspecified: Secondary | ICD-10-CM

## 2015-06-08 HISTORY — DX: Noninfective gastroenteritis and colitis, unspecified: K52.9

## 2015-06-13 ENCOUNTER — Telehealth: Payer: Self-pay | Admitting: Neurology

## 2015-06-13 NOTE — Telephone Encounter (Signed)
PA submitted on CoverMyMeds

## 2015-06-13 NOTE — Telephone Encounter (Signed)
Pt called sts she has new insurance- [employer]  ID# [id number]  GRP # [id number]  BIN# [id number] effective 06/08/15.

## 2015-06-13 NOTE — Telephone Encounter (Signed)
Jimmie Molly w/[employer] [phone] opt 2 ref # [id number] called requesting PA for AVONEX PEN 30 MCG/0.5ML AJKT

## 2015-06-26 ENCOUNTER — Encounter (HOSPITAL_COMMUNITY): Payer: Self-pay | Admitting: *Deleted

## 2015-06-26 ENCOUNTER — Inpatient Hospital Stay (HOSPITAL_COMMUNITY)
Admission: EM | Admit: 2015-06-26 | Discharge: 2015-06-27 | DRG: 387 | Disposition: A | Discharge status: Home / Self Care | Payer: Commercial Managed Care - PPO | Attending: Internal Medicine | Admitting: Internal Medicine

## 2015-06-26 ENCOUNTER — Emergency Department (HOSPITAL_COMMUNITY): Payer: Commercial Managed Care - PPO

## 2015-06-26 DIAGNOSIS — I10 Essential (primary) hypertension: Secondary | ICD-10-CM | POA: Diagnosis present

## 2015-06-26 DIAGNOSIS — Z79899 Other long term (current) drug therapy: Secondary | ICD-10-CM

## 2015-06-26 DIAGNOSIS — Z809 Family history of malignant neoplasm, unspecified: Secondary | ICD-10-CM

## 2015-06-26 DIAGNOSIS — K5150 Left sided colitis without complications: Secondary | ICD-10-CM | POA: Diagnosis not present

## 2015-06-26 DIAGNOSIS — K922 Gastrointestinal hemorrhage, unspecified: Secondary | ICD-10-CM

## 2015-06-26 DIAGNOSIS — K529 Noninfective gastroenteritis and colitis, unspecified: Secondary | ICD-10-CM | POA: Diagnosis present

## 2015-06-26 DIAGNOSIS — K219 Gastro-esophageal reflux disease without esophagitis: Secondary | ICD-10-CM | POA: Diagnosis present

## 2015-06-26 DIAGNOSIS — G35 Multiple sclerosis: Secondary | ICD-10-CM | POA: Diagnosis present

## 2015-06-26 DIAGNOSIS — E039 Hypothyroidism, unspecified: Secondary | ICD-10-CM | POA: Diagnosis present

## 2015-06-26 LAB — COMPREHENSIVE METABOLIC PANEL
ALT: 19 U/L (ref 14–54)
AST: 22 U/L (ref 15–41)
Albumin: 3.9 g/dL (ref 3.5–5.0)
Alkaline Phosphatase: 57 U/L (ref 38–126)
Anion gap: 12 (ref 5–15)
BILIRUBIN TOTAL: 0.5 mg/dL (ref 0.3–1.2)
BUN: 7 mg/dL (ref 6–20)
CO2: 24 mmol/L (ref 22–32)
CREATININE: 0.8 mg/dL (ref 0.44–1.00)
Calcium: 9.3 mg/dL (ref 8.9–10.3)
Chloride: 104 mmol/L (ref 101–111)
GFR calc Af Amer: >60 mL/min (ref >60)
Glucose, Bld: 97 mg/dL (ref 65–99)
Potassium: 3.6 mmol/L (ref 3.5–5.1)
Sodium: 140 mmol/L (ref 135–145)
TOTAL PROTEIN: 7 g/dL (ref 6.5–8.1)

## 2015-06-26 LAB — TYPE AND SCREEN
ABO/RH(D): O NEG
ANTIBODY SCREEN: NEGATIVE

## 2015-06-26 LAB — URINALYSIS, ROUTINE W REFLEX MICROSCOPIC
BILIRUBIN URINE: NEGATIVE
GLUCOSE, UA: NEGATIVE mg/dL
Hgb urine dipstick: NEGATIVE
KETONES UR: NEGATIVE mg/dL
LEUKOCYTES UA: NEGATIVE
NITRITE: NEGATIVE
PROTEIN: NEGATIVE mg/dL
Specific Gravity, Urine: 1.018 (ref 1.005–1.030)
pH: 7 (ref 5.0–8.0)

## 2015-06-26 LAB — LIPASE, BLOOD: Lipase: 29 U/L (ref 11–51)

## 2015-06-26 LAB — ABO/RH: ABO/RH(D): O NEG

## 2015-06-26 LAB — CBC
HCT: 43.4 % (ref 36.0–46.0)
Hemoglobin: 13.7 g/dL (ref 12.0–15.0)
MCH: 27.6 pg (ref 26.0–34.0)
MCHC: 31.6 g/dL (ref 30.0–36.0)
MCV: 87.5 fL (ref 78.0–100.0)
PLATELETS: 292 10*3/uL (ref 150–400)
RBC: 4.96 MIL/uL (ref 3.87–5.11)
RDW: 14.2 % (ref 11.5–15.5)
WBC: 6.9 10*3/uL (ref 4.0–10.5)

## 2015-06-26 LAB — POC OCCULT BLOOD, ED: Fecal Occult Bld: POSITIVE — AB

## 2015-06-26 MED ORDER — METRONIDAZOLE IN NACL 5-0.79 MG/ML-% IV SOLN
500.0000 mg | Freq: Once | INTRAVENOUS | Status: AC
  Administered 2015-06-27: 500 mg via INTRAVENOUS
  Filled 2015-06-26: qty 100

## 2015-06-26 MED ORDER — IOHEXOL 300 MG/ML  SOLN
100.0000 mL | Freq: Once | INTRAMUSCULAR | Status: AC | PRN
  Administered 2015-06-26: 100 mL via INTRAVENOUS

## 2015-06-26 MED ORDER — IOHEXOL 300 MG/ML  SOLN: 25.0000 mL | Freq: Once | INTRAMUSCULAR | Status: DC | PRN

## 2015-06-26 MED ORDER — CIPROFLOXACIN IN D5W 400 MG/200ML IV SOLN
400.0000 mg | Freq: Once | INTRAVENOUS | Status: AC
  Administered 2015-06-27: 400 mg via INTRAVENOUS
  Filled 2015-06-26: qty 200

## 2015-06-26 NOTE — ED Provider Notes (Signed)
CSN: [id number]     Arrival date & time 06/26/15  1418 History   First MD Initiated Contact with Patient 06/26/15 1530     Chief Complaint  Patient presents with  . Diarrhea     (Consider location/radiation/quality/duration/timing/severity/associated sxs/prior Treatment) HPI   60 year old female who presents with rectal bleeding. History of hypertension. Does not take any anticoagulation. States that yesterday developed multiple episodes of diarrhea throughout the day with nausea and decreased appetite.  Associated with intermittent abdominal cramping, that progressed to left lower quadrant abdominal pain. States that around 10 PM yesterday evening began to pass blood with bowel movements. Passing blood clots, and reports that diarrhea was just purely blood with blood clots. Presented to ED today for evaluation. No fevers or chills. Last had colonoscopy in 2009 that was unremarkable. No prior history of GI bleed in the past. No prolonged NSAID usage.  No melena or vomiting. States that she has had several rounds of antibiotics this year, at least 3-4 courses over the past few months.   Past Medical History  Diagnosis Date  . High blood pressure   . Thyroid disorder   . MS (multiple sclerosis) ([place])    History reviewed. No pertinent past surgical history. Family History  Problem Relation Age of Onset  . Cancer Mother   . Cancer Father   . Cancer Maternal Grandmother    Social History  Substance Use Topics  . Smoking status: Never Smoker   . Smokeless tobacco: Never Used  . Alcohol Use: No     Comment: caffeine drinks 1-2 daily   OB History    No data available     Review of Systems 10/14 systems reviewed and are negative other than those stated in the HPI   Allergies  Sulfamethoxazole-trimethoprim  Home Medications   Prior to Admission medications   Medication Sig Start Date End Date Taking? Authorizing Provider  amLODipine (NORVASC) 5 MG tablet Take 5 mg by mouth  daily.  02/08/13  Yes Historical Provider, MD  AVONEX PEN 30 MCG/0.5ML AJKT INJECT 30MCG IM WEEKLY:HOLD PEN FIRMLY AGAINST THIGH AND PRESS BLUE BUTTON. HOLD IN PLACE FOR 10 SECONDS. REFRIGERATE. ALLOW TO WARM TO ROOM 06/23/14  Yes Kathrynn Ducking, MD  Calcium Carbonate-Vitamin D (CALCIUM + D PO) Take 600 mg by mouth daily.   Yes Historical Provider, MD  Cinnamon 500 MG TABS Take 500 mg by mouth 2 (two) times daily.   Yes Historical Provider, MD  levothyroxine (SYNTHROID, LEVOTHROID) 75 MCG tablet 1 TABLET ONCE A DAY ORALLY 90 DAYS 12/22/14  Yes Historical Provider, MD  nystatin-triamcinolone (MYCOLOG II) cream Apply 1 application topically daily as needed (irration).  03/18/14  Yes Historical Provider, MD  simvastatin (ZOCOR) 5 MG tablet Take 5 mg by mouth every evening.  07/16/12  Yes Historical Provider, MD  trandolapril (MAVIK) 4 MG tablet Take 4 mg by mouth daily. 05/09/14  Yes Historical Provider, MD  trimethoprim (TRIMPEX) 100 MG tablet Take 100 mg by mouth daily as needed (UTI).  12/31/14  Yes Historical Provider, MD   BP 147/84 mmHg  Pulse 71  Temp(Src) 98 F (36.7 C) (Oral)  Resp 15  SpO2 98% Physical Exam Physical Exam  Nursing note and vitals reviewed. Constitutional: Well developed, well nourished, non-toxic, and in no acute distress Head: Normocephalic and atraumatic.  Mouth/Throat: Oropharynx is clear and moist.  Neck: Normal range of motion. Neck supple.  Cardiovascular: Normal rate and regular rhythm.   Pulmonary/Chest: Effort normal and breath sounds  normal.  Abdominal: Soft. There is no tenderness. There is no rebound and no guarding.  No hemorrhoids or fissures on rectal exam. No stool in rectum. Streaks of blood on rectal exam.  Musculoskeletal: Normal range of motion.  Neurological: Alert, no facial droop, fluent speech, moves all extremities symmetrically Skin: Skin is warm and dry.  Psychiatric: Cooperative  ED Course  Procedures (including critical care time) Labs  Review Labs Reviewed  POC OCCULT BLOOD, ED - Abnormal; Notable for the following:    Fecal Occult Bld POSITIVE (*)    All other components within normal limits  C DIFFICILE QUICK SCREEN W PCR REFLEX  LIPASE, BLOOD  COMPREHENSIVE METABOLIC PANEL  CBC  URINALYSIS, ROUTINE W REFLEX MICROSCOPIC (NOT AT [facility])  TYPE AND SCREEN  ABO/RH    Imaging Review Ct Abdomen Pelvis W Contrast  06/26/2015  CLINICAL DATA:  Diarrhea 2 days with bright-red blood per rectum and lower abdominal cramping. EXAM: CT ABDOMEN AND PELVIS WITH CONTRAST TECHNIQUE: Multidetector CT imaging of the abdomen and pelvis was performed using the standard protocol following bolus administration of intravenous contrast. CONTRAST:  166mL OMNIPAQUE IOHEXOL 300 MG/ML  SOLN COMPARISON:  Lumbar spine 01/18/2010 FINDINGS: Lung bases are within normal. Abdominal images demonstrate the liver, spleen, gallbladder, pancreas and adrenal glands to be within normal. The stomach is normal. Kidneys are normal in size without hydronephrosis or nephrolithiasis. There are a couple small parapelvic left renal cysts. Ureters are within normal. Appendix is normal. There is moderate circumferential wall thickening of the mid to distal aspect of the descending colon with mild adjacent inflammatory change in the pericolonic fat. Findings compatible with acute colitis. No evidence of perforation or abscess. Secondary inflammation of an epiploic appendage anterior to the distal aspect of the descending colon in the left lower quadrant. Small bowel is within normal. Subtle hazy attenuation of the mid mesentery which may be due to mesenteric panniculitis. Few small associated mesenteric lymph nodes. Mild calcified plaque over the abdominal aorta and iliac arteries. Pelvic images demonstrate the uterus, ovaries, bladder and rectum to be within normal. There mild degenerate changes of the spine and hips. Mild stable compression deformity of L1. IMPRESSION: Evidence of  acute colitis involving the mid to distal aspect of the descending colon. No perforation or abscess. Secondary inflammation of an epiploic appendage adjacent the distal aspect of the descending colon. Several parapelvic left renal cysts. Hazy attenuation of the mid mesentery with a few small associated mesenteric lymph nodes suggesting mild mesenteric panniculitis. Stable mild L1 compression deformity. Electronically Signed   By: Marin Olp M.D.   On: 06/26/2015 20:50   I have personally reviewed and evaluated these images and lab results as part of my medical decision-making.   EKG Interpretation None      MDM   Final diagnoses:  Lower GI bleed  Acute colitis   60 year old female who presents with bloody diarrhea. Presentation is well-appearing and in no acute distress. Vital signs all within normal limits. Abdomen at the time of my evaluation benign, and overall non-tender. Rectal exam revealing only small streaks of blood without stool, but just prior to my exam she did have episode of bloody diarrhea. CT revealing acute colitis without diverticulitis. Has normal hemoglobin. C diff sent and pending. Discussed with Dr. Ardis Hughs from [place]. Recommending antibiotics with admission for serial hemoglobin. He will evaluate patient in the morning. Admitted to [facility] hospitalist with cipro and flagyl.   Forde Dandy, MD 06/26/15 2352

## 2015-06-26 NOTE — ED Provider Notes (Signed)
MSE was initiated and I personally evaluated the patient and placed orders (if any) at  3:30 PM on June 26, 2015.  The patient appears stable so that the remainder of the MSE may be completed by another provider.  Lower GI bleed. With lower left quadrant pain. Labs. CT scan  Jola Schmidt, MD 06/26/15 1531

## 2015-06-26 NOTE — ED Notes (Signed)
Pt reports diarrhea for 2 days then had bright red bleeding during the night with lower abdominal cramping. Pt has had multiple episodes of bleeding today.

## 2015-06-27 ENCOUNTER — Encounter (HOSPITAL_COMMUNITY): Payer: Self-pay | Admitting: *Deleted

## 2015-06-27 DIAGNOSIS — I10 Essential (primary) hypertension: Secondary | ICD-10-CM | POA: Diagnosis present

## 2015-06-27 DIAGNOSIS — K529 Noninfective gastroenteritis and colitis, unspecified: Secondary | ICD-10-CM | POA: Diagnosis present

## 2015-06-27 DIAGNOSIS — G35 Multiple sclerosis: Secondary | ICD-10-CM | POA: Diagnosis present

## 2015-06-27 DIAGNOSIS — E039 Hypothyroidism, unspecified: Secondary | ICD-10-CM | POA: Diagnosis present

## 2015-06-27 DIAGNOSIS — K219 Gastro-esophageal reflux disease without esophagitis: Secondary | ICD-10-CM | POA: Diagnosis present

## 2015-06-27 DIAGNOSIS — K5150 Left sided colitis without complications: Secondary | ICD-10-CM | POA: Diagnosis present

## 2015-06-27 DIAGNOSIS — Z79899 Other long term (current) drug therapy: Secondary | ICD-10-CM | POA: Diagnosis not present

## 2015-06-27 DIAGNOSIS — K922 Gastrointestinal hemorrhage, unspecified: Secondary | ICD-10-CM | POA: Diagnosis present

## 2015-06-27 DIAGNOSIS — Z809 Family history of malignant neoplasm, unspecified: Secondary | ICD-10-CM | POA: Diagnosis not present

## 2015-06-27 LAB — C DIFFICILE QUICK SCREEN W PCR REFLEX
C DIFFICILE (CDIFF) INTERP: NEGATIVE
C DIFFICILE (CDIFF) TOXIN: NEGATIVE
C DIFFICLE (CDIFF) ANTIGEN: NEGATIVE

## 2015-06-27 LAB — CBC WITH DIFFERENTIAL/PLATELET
Basophils Absolute: 0 10*3/uL (ref 0.0–0.1)
Basophils Relative: 0 %
EOS ABS: 0.2 10*3/uL (ref 0.0–0.7)
Eosinophils Relative: 3 %
HCT: 40.6 % (ref 36.0–46.0)
HEMOGLOBIN: 13.3 g/dL (ref 12.0–15.0)
LYMPHS ABS: 1.7 10*3/uL (ref 0.7–4.0)
LYMPHS PCT: 30 %
MCH: 28.9 pg (ref 26.0–34.0)
MCHC: 32.8 g/dL (ref 30.0–36.0)
MCV: 88.1 fL (ref 78.0–100.0)
Monocytes Absolute: 0.5 10*3/uL (ref 0.1–1.0)
Monocytes Relative: 8 %
NEUTROS PCT: 59 %
Neutro Abs: 3.5 10*3/uL (ref 1.7–7.7)
Platelets: 224 10*3/uL (ref 150–400)
RBC: 4.61 MIL/uL (ref 3.87–5.11)
RDW: 14.3 % (ref 11.5–15.5)
WBC: 5.9 10*3/uL (ref 4.0–10.5)

## 2015-06-27 MED ORDER — DEXTROSE-NACL 5-0.45 % IV SOLN
INTRAVENOUS | Status: DC
  Administered 2015-06-27: 04:00:00 via INTRAVENOUS

## 2015-06-27 MED ORDER — SIMVASTATIN 5 MG PO TABS
5.0000 mg | ORAL_TABLET | Freq: Every evening | ORAL | Status: DC
  Filled 2015-06-27: qty 1

## 2015-06-27 MED ORDER — AMLODIPINE BESYLATE 5 MG PO TABS: 5.0000 mg | ORAL_TABLET | Freq: Every day | ORAL | Status: DC

## 2015-06-27 MED ORDER — CIPROFLOXACIN IN D5W 400 MG/200ML IV SOLN
400.0000 mg | Freq: Two times a day (BID) | INTRAVENOUS | Status: DC
  Administered 2015-06-27: 400 mg via INTRAVENOUS
  Filled 2015-06-27 (×2): qty 200

## 2015-06-27 MED ORDER — ENOXAPARIN SODIUM 40 MG/0.4ML ~~LOC~~ SOLN: 40.0000 mg | SUBCUTANEOUS | Status: DC

## 2015-06-27 MED ORDER — PANTOPRAZOLE SODIUM 40 MG IV SOLR
40.0000 mg | INTRAVENOUS | Status: DC
Start: 2015-06-27 — End: 2015-06-27

## 2015-06-27 MED ORDER — CIPROFLOXACIN HCL 500 MG PO TABS: 500.0000 mg | ORAL_TABLET | Freq: Two times a day (BID) | ORAL | Status: DC

## 2015-06-27 MED ORDER — LEVOTHYROXINE SODIUM 75 MCG PO TABS
75.0000 ug | ORAL_TABLET | Freq: Every day | ORAL | Status: DC
  Administered 2015-06-27: 75 ug via ORAL
  Filled 2015-06-27: qty 1

## 2015-06-27 MED ORDER — METRONIDAZOLE 500 MG PO TABS: 500.0000 mg | ORAL_TABLET | Freq: Three times a day (TID) | ORAL | Status: DC

## 2015-06-27 MED ORDER — METRONIDAZOLE IN NACL 5-0.79 MG/ML-% IV SOLN
500.0000 mg | Freq: Three times a day (TID) | INTRAVENOUS | Status: DC
Start: 2015-06-27 — End: 2015-06-27
  Administered 2015-06-27 (×2): 500 mg via INTRAVENOUS
  Filled 2015-06-27 (×4): qty 100

## 2015-06-27 MED ORDER — TRANDOLAPRIL 4 MG PO TABS
4.0000 mg | ORAL_TABLET | Freq: Every day | ORAL | Status: DC
  Filled 2015-06-27: qty 1

## 2015-06-27 MED ORDER — MORPHINE SULFATE (PF) 2 MG/ML IV SOLN: 2.0000 mg | INTRAVENOUS | Status: DC | PRN

## 2015-06-27 NOTE — Discharge Summary (Signed)
Physician Discharge Summary  Christina Carey [id number] DOB: Feb 14, 1956 DOA: 06/26/2015  PCP: Thressa Sheller, MD  Admit date: 06/26/2015 Discharge date: 06/27/2015  Time spent: 35 minutes  Recommendations for Outpatient Follow-up:  Follow up with GI for colonoscopy  Discharge Diagnoses:  Active Problems:   Colitis   Discharge Condition: stable  Diet recommendation: heart healthy  There were no vitals filed for this visit.  History of present illness:  History of Present Illness:  Patient is a 60 yo female with history of HTN and MS who came with cc of diarrhea, watery to soft stool, started yesterday, associated with cramping diffuse abdominal pain L>R, without N/V, with passing red blood (hematochezia) for the last few BMs last night. She denies cough, dyspnea, chest pain, fever, chills, dysuria. No other complaints.   Hospital Course:  1-Colitis;  Continue with cipro and flagyl.  She is immunocompromise,. Recommend GI pathogen.. Defer to GI, PA will ask her attending.  GI was consulted.  Per GI if patient tolerates diet she can be discharge today. I was called by nurse, patient doing well, wants to go home Patient improved faster than expected.  Discharge on cipro and flagyl for 7 days. Unable to send stool culture, no BM Patient will be advised to discussed with her neurologist about continuing with Interferon.   2-Hematochezia;  Suspect related to colitis. HB has remain stable.  IV protonix.  Hb stable  3-DVT prophylaxis: hold lovenox due to hematochezia. Will order SCD.   4-HTN; hold norvasc at discharge , continue with ACE 5-MS; On interferon   Procedures: none Consultations:  GI  Discharge Exam: Filed Vitals:   06/27/15 0300 06/27/15 1410  BP: 138/77 133/75  Pulse: 58 75  Temp: 97.9 F (36.6 C) 97.9 F (36.6 C)  Resp: 16 16    General: NAD Cardiovascular: S 1, S 2 RRR Respiratory: S 1, S 2 RRR  Discharge Instructions   Discharge  Instructions    Diet - low sodium heart healthy    Complete by:  As directed      Increase activity slowly    Complete by:  As directed           Current Discharge Medication List    START taking these medications   Details  ciprofloxacin (CIPRO) 500 MG tablet Take 1 tablet (500 mg total) by mouth 2 (two) times daily. Qty: 14 tablet, Refills: 0    metroNIDAZOLE (FLAGYL) 500 MG tablet Take 1 tablet (500 mg total) by mouth 3 (three) times daily. Qty: 21 tablet, Refills: 0      CONTINUE these medications which have NOT CHANGED   Details  AVONEX PEN 30 MCG/0.5ML AJKT INJECT 30MCG IM WEEKLY:HOLD PEN FIRMLY AGAINST THIGH AND PRESS BLUE BUTTON. HOLD IN PLACE FOR 10 SECONDS. REFRIGERATE. ALLOW TO WARM TO ROOM Qty: 1 each, Refills: 6    Calcium Carbonate-Vitamin D (CALCIUM + D PO) Take 600 mg by mouth daily.    Cinnamon 500 MG TABS Take 500 mg by mouth 2 (two) times daily.    levothyroxine (SYNTHROID, LEVOTHROID) 75 MCG tablet 1 TABLET ONCE A DAY ORALLY 90 DAYS Refills: 1    nystatin-triamcinolone (MYCOLOG II) cream Apply 1 application topically daily as needed (irration).  Refills: 1    simvastatin (ZOCOR) 5 MG tablet Take 5 mg by mouth every evening.     trandolapril (MAVIK) 4 MG tablet Take 4 mg by mouth daily. Refills: 1      STOP taking these medications  amLODipine (NORVASC) 5 MG tablet      trimethoprim (TRIMPEX) 100 MG tablet        Allergies  Allergen Reactions  . Sulfamethoxazole-Trimethoprim     REACTION: rash, taking trimethoprim now and is not having a problem. 01-05-15.   Follow-up Information    Follow up with Manus Gunning, MD In 1 week.   Specialty:  Gastroenterology   Contact information:   [address] [place] [address] [phone]        The results of significant diagnostics from this hospitalization (including imaging, microbiology, ancillary and laboratory) are listed below for reference.    Significant  Diagnostic Studies: Ct Abdomen Pelvis W Contrast  06/26/2015  CLINICAL DATA:  Diarrhea 2 days with bright-red blood per rectum and lower abdominal cramping. EXAM: CT ABDOMEN AND PELVIS WITH CONTRAST TECHNIQUE: Multidetector CT imaging of the abdomen and pelvis was performed using the standard protocol following bolus administration of intravenous contrast. CONTRAST:  140mL OMNIPAQUE IOHEXOL 300 MG/ML  SOLN COMPARISON:  Lumbar spine 01/18/2010 FINDINGS: Lung bases are within normal. Abdominal images demonstrate the liver, spleen, gallbladder, pancreas and adrenal glands to be within normal. The stomach is normal. Kidneys are normal in size without hydronephrosis or nephrolithiasis. There are a couple small parapelvic left renal cysts. Ureters are within normal. Appendix is normal. There is moderate circumferential wall thickening of the mid to distal aspect of the descending colon with mild adjacent inflammatory change in the pericolonic fat. Findings compatible with acute colitis. No evidence of perforation or abscess. Secondary inflammation of an epiploic appendage anterior to the distal aspect of the descending colon in the left lower quadrant. Small bowel is within normal. Subtle hazy attenuation of the mid mesentery which may be due to mesenteric panniculitis. Few small associated mesenteric lymph nodes. Mild calcified plaque over the abdominal aorta and iliac arteries. Pelvic images demonstrate the uterus, ovaries, bladder and rectum to be within normal. There mild degenerate changes of the spine and hips. Mild stable compression deformity of L1. IMPRESSION: Evidence of acute colitis involving the mid to distal aspect of the descending colon. No perforation or abscess. Secondary inflammation of an epiploic appendage adjacent the distal aspect of the descending colon. Several parapelvic left renal cysts. Hazy attenuation of the mid mesentery with a few small associated mesenteric lymph nodes suggesting mild  mesenteric panniculitis. Stable mild L1 compression deformity. Electronically Signed   By: Marin Olp M.D.   On: 06/26/2015 20:50    Microbiology: Recent Results (from the past 240 hour(s))  C difficile quick scan w PCR reflex     Status: None   Collection Time: 06/26/15 10:40 PM  Result Value Ref Range Status   C Diff antigen NEGATIVE NEGATIVE Final   C Diff toxin NEGATIVE NEGATIVE Final   C Diff interpretation Negative for toxigenic C. difficile  Final     Labs: Basic Metabolic Panel:  Recent Labs Lab 06/26/15 1538  NA 140  K 3.6  CL 104  CO2 24  GLUCOSE 97  BUN 7  CREATININE 0.80  CALCIUM 9.3   Liver Function Tests:  Recent Labs Lab 06/26/15 1538  AST 22  ALT 19  ALKPHOS 57  BILITOT 0.5  PROT 7.0  ALBUMIN 3.9    Recent Labs Lab 06/26/15 1538  LIPASE 29   No results for input(s): AMMONIA in the last 168 hours. CBC:  Recent Labs Lab 06/26/15 1538 06/27/15 0546  WBC 6.9 5.9  NEUTROABS  --  3.5  HGB 13.7 13.3  HCT 43.4 40.6  MCV 87.5 88.1  PLT 292 224   Cardiac Enzymes: No results for input(s): CKTOTAL, CKMB, CKMBINDEX, TROPONINI in the last 168 hours. BNP: BNP (last 3 results) No results for input(s): BNP in the last 8760 hours.  ProBNP (last 3 results) No results for input(s): PROBNP in the last 8760 hours.  CBG: No results for input(s): GLUCAP in the last 168 hours.     Signed:  Elmarie Shiley MD.  [facility] 06/27/2015, 6:46 PM

## 2015-06-27 NOTE — Progress Notes (Signed)
Patient discharge home. Home discharge instruction given to patient, no question verbalized.

## 2015-06-27 NOTE — Consult Note (Signed)
[facility] Consult: 10:23 AM 06/27/2015  LOS: 0 days    Referring Provider: DR Tyrell Antonio  Primary Care Physician:  Thressa Sheller, MD Primary Gastroenterologist:  Dr. Olevia Perches.     Reason for Consultation:  Colitis.    HPI: Christina Carey is a 60 y.o. female.  Hx multiple sclerosis, HTN, hypothyroidism.   01/2013 ultrasound abdomen: normal.  06/2008 EGD, Dr Olevia Perches, for belching, bloating, chest pain, normal ultrasound.  Small 1 cm sliding HH, otherwise normal study.  GE Jx biopsied: mild inflammation c/w GER.   Urease testing negative.  02/2013 HIDA scan: patent cystic and common bile ducts.  GB EF 87% 11/2007 colonoscopy.  Average risk screening study.  Study to the cecum was entirely normal.  06/18/15 through 06/19/15 patient had nonbloody diarrhea with nausea vomiting. Other family members had the same type of symptoms as well.  GI symptoms completely resolved. Her baseline stool pattern is alternating between constipation and diarrhea. Sometimes with the diarrhea she will have some stool incontinence. She also says that maybe 4 times in the last year she has seen some scant bloody mucoid discharge after having a bowel movement.  She consistently sees nonbloody mucoid discharge after bowel movements as well. On 2/18 in the afternoon she had 3-4 episodes of nonbloody, loose stools watery diarrhea. This resolved after taking Imodium which allowed her to attend a wedding. At about 10 PM that same day she developed lower abdominal cramps and proceeded to have 10 or so episodes of passing blood. She describes her blood is sitting down at the bottom of the commode, it did not entirely transform the commode water to a bloody appearance. She had no nausea or vomiting. She went to an urgent care center but was referred to  the emergency room. Since the admission she was started empirically on antibiotics. The volume of blood significantly diminished later yesterday. Her last episode was early this morning and was just a very small amount of blood. Patient does take 400 mg of ibuprofen, to prevent flulike reaction, once a week to accompany her weekly Avonex shot  CT scan shows colitis and mid/distal ascending colon with secondary inflammation of an epiploic appendage adjacent the distal aspect of the descending colon.  Hazy attenuation of the mid mesentery with a few small associated mesenteric lymph nodes suggesting mild mesenteric panniculitis. Mild calcified plaque over the abdominal aorta and iliac arteries. WBCs normal.  No anemia.  Chemistries and LFTs normal.   Stool c diff study normal.  FOBT is +.      Past Medical History  Diagnosis Date  . High blood pressure   . Thyroid disorder   . MS (multiple sclerosis) ([place])  History reviewed. No pertinent past surgical history.  Prior to Admission medications   Medication Sig Start Date End Date Taking? Authorizing Provider  amLODipine (NORVASC) 5 MG tablet Take 5 mg by mouth daily.  02/08/13  Yes Historical Provider, MD  AVONEX PEN 30 MCG/0.5ML AJKT INJECT 30MCG IM WEEKLY:HOLD PEN FIRMLY AGAINST THIGH AND PRESS BLUE BUTTON. HOLD IN PLACE FOR 10 SECONDS. REFRIGERATE. ALLOW TO WARM TO ROOM 06/23/14  Yes Kathrynn Ducking, MD  Calcium Carbonate-Vitamin D (CALCIUM + D PO) Take 600 mg by mouth daily.   Yes Historical Provider, MD  Cinnamon 500 MG TABS Take 500 mg by mouth 2 (two) times daily.   Yes Historical Provider, MD  levothyroxine (SYNTHROID, LEVOTHROID) 75 MCG tablet 1 TABLET ONCE A DAY ORALLY 90 DAYS 12/22/14  Yes Historical Provider, MD  nystatin-triamcinolone (MYCOLOG II) cream Apply 1 application topically daily as needed (irration).  03/18/14   Yes Historical Provider, MD  simvastatin (ZOCOR) 5 MG tablet Take 5 mg by mouth every evening.  07/16/12  Yes Historical Provider, MD  trandolapril (MAVIK) 4 MG tablet Take 4 mg by mouth daily. 05/09/14  Yes Historical Provider, MD  trimethoprim (TRIMPEX) 100 MG tablet Take 100 mg by mouth daily as needed (UTI).  12/31/14  Yes Historical Provider, MD    Scheduled Meds: . ciprofloxacin  400 mg Intravenous Q12H  . levothyroxine  75 mcg Oral QAC breakfast  . metronidazole  500 mg Intravenous Q8H  . pantoprazole (PROTONIX) IV  40 mg Intravenous Q24H  . simvastatin  5 mg Oral QPM   Infusions: . dextrose 5 % and 0.45% NaCl 100 mL/hr at 06/27/15 0347   PRN Meds: iohexol, morphine injection   Allergies as of 06/26/2015 - Review Complete 06/26/2015  Allergen Reaction Noted  . Sulfamethoxazole-trimethoprim  11/06/2007    Family History  Problem Relation Age of Onset  . Cancer Mother   . Cancer Father   . Cancer Maternal Grandmother     Social History   Social History  . Marital Status: Married    Spouse Name: Purcell Nails  . Number of Children: 2  . Years of Education: 16   Occupational History  . ACCOUNTING    Social History Main Topics  . Smoking status: Never Smoker   . Smokeless tobacco: Never Used  . Alcohol Use: No     Comment: caffeine drinks 1-2 daily  . Drug Use: No  . Sexual Activity: Not on file   Other Topics Concern  . Not on file   Social History Narrative   Patient lives at home with her husband Farrel Gordon. and they have 2 adult children. 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    Patient is right- handed.   Patient drinks one cup of coffee daily and soda very rarely.    REVIEW OF SYSTEMS: Constitutional: essentially stable weight. Generally good energy level.ENT:  No nose bleeds Pulmno cough. No breathing problems. CV:  No palpitations, no LE edema.  No chest pain GU:  No hematuria. Some bladder incontinence. GI:   No dysphagia.  No heartburn. See history of present illness for further details. Heme:  No history of blood transfusions or anemia. No bleeding or bruising tendencies.    Transfusions:  None ever  Neuro:  No headaches, no peripheral tingling or numbness.  Denies gait disorder or stumbling.  Derm:  No itching, no rash or sores.  Endocrine:  No sweats or chills.  No polyuria or dysuria Immunization:  DID NOT INQUIRE. Travel:  None beyond local counties in last few months.    PHYSICAL EXAM: Vital signs in last 24 hours: Filed Vitals:   06/27/15 0153 06/27/15 0300  BP: 140/82 138/77  Pulse: 61 58  Temp: 98.1 F (36.7 C) 97.9 F (36.6 C)  Resp: 18 16   Wt Readings from Last 3 Encounters:  01/05/15 92.625 kg (204 lb 3.2 oz)  06/28/14 90.719 kg (200 lb)  05/31/14 92.08 kg (203 lb)    General: Pleasant, well appearing WF. She is comfortable. Head:  no swelling, no facial asymmetry.  Eyes:  no scleral icterus, no conjunctival pallor. Ears:  no hearing deficit.  Nose:  No discharge  Mouth:  Moist, pink, clear oral mucosa. Dentition good.  Neck:  no JVD, no TMG, no mass. Lungs:  clear to auscultation and percussion bilaterally. No labored breathing. No cough.  Heart: RRR. No MRG. S1/S2 audible.  Abdomen:  soft. Slight tenderness in the left lower abdomen. No guarding. No rebound. Bowel sounds active. No HSM or hernias..   Rectal: Scant specks of red blood on exam glove. No visible or palpable hemorrhoids.    no masses.  Musc/Skeltl:  no joint swelling, erythema or contracture deformities.  Extremities:  No CCE.  Neurologic:  Alert. Oriented 3. Moves all 4 limbs, strength not tested. No gross tremor or gross neurologic deficits.  Skin: no rash, no AVMs. Tatoos:  None  Nodes:  no cervical adenopathy.   Psych:  pleasant, calm, in good spirits.  Intake/Output from previous day: 02/19 0701 - 02/20 0700 In: 0  Out: 500 [Urine:500] Intake/Output this shift:    LAB RESULTS:  Recent Labs   06/26/15 1538 06/27/15 0546  WBC 6.9 5.9  HGB 13.7 13.3  HCT 43.4 40.6  PLT 292 224   BMET Lab Results  Component Value Date   NA 140 06/26/2015   K 3.6 06/26/2015   CL 104 06/26/2015   CO2 24 06/26/2015   GLUCOSE 97 06/26/2015   BUN 7 06/26/2015   CREATININE 0.80 06/26/2015   CALCIUM 9.3 06/26/2015   LFT  Recent Labs  06/26/15 1538  PROT 7.0  ALBUMIN 3.9  AST 22  ALT 19  ALKPHOS 57  BILITOT 0.5   PT/INR No results found for: INR, PROTIME Hepatitis Panel No results for input(s): HEPBSAG, HCVAB, HEPAIGM, HEPBIGM in the last 72 hours. C-Diff No components found for: CDIFF Lipase     Component Value Date/Time   LIPASE 29 06/26/2015 1538    Drugs of Abuse  No results found for: LABOPIA, COCAINSCRNUR, LABBENZ, AMPHETMU, THCU, LABBARB   RADIOLOGY STUDIES: Ct Abdomen Pelvis W Contrast  06/26/2015  CLINICAL DATA:  Diarrhea 2 days with bright-red blood per rectum and lower abdominal cramping. EXAM: CT ABDOMEN AND PELVIS WITH CONTRAST TECHNIQUE: Multidetector CT imaging of the abdomen and pelvis was performed using the standard protocol following bolus administration of intravenous contrast. CONTRAST:  127mL OMNIPAQUE IOHEXOL 300 MG/ML  SOLN COMPARISON:  Lumbar spine 01/18/2010 FINDINGS: Lung bases are within normal. Abdominal images demonstrate the liver, spleen, gallbladder, pancreas and adrenal glands to be within normal. The stomach is normal. Kidneys are normal in size without hydronephrosis or nephrolithiasis. There are a couple small parapelvic left renal cysts. Ureters are within normal. Appendix is normal. There is moderate circumferential wall thickening of the mid to distal aspect of the descending colon with mild adjacent inflammatory change in the pericolonic fat. Findings compatible with acute colitis. No evidence of perforation or  abscess. Secondary inflammation of an epiploic appendage anterior to the distal aspect of the descending colon in the left lower  quadrant. Small bowel is within normal. Subtle hazy attenuation of the mid mesentery which may be due to mesenteric panniculitis. Few small associated mesenteric lymph nodes. Mild calcified plaque over the abdominal aorta and iliac arteries. Pelvic images demonstrate the uterus, ovaries, bladder and rectum to be within normal. There mild degenerate changes of the spine and hips. Mild stable compression deformity of L1. IMPRESSION: Evidence of acute colitis involving the mid to distal aspect of the descending colon. No perforation or abscess. Secondary inflammation of an epiploic appendage adjacent the distal aspect of the descending colon. Several parapelvic left renal cysts. Hazy attenuation of the mid mesentery with a few small associated mesenteric lymph nodes suggesting mild mesenteric panniculitis. Stable mild L1 compression deformity. Electronically Signed   By: Marin Olp M.D.   On: 06/26/2015 20:50    ENDOSCOPIC STUDIES: Per HPI  IMPRESSION:   *  Left sided colitis.  FOBT no fever, normal WBC count. day 2 empiric Cipro and Flagyl.  Symptoms improving.  Question infectious versus ischemic. Wonder if she has developed some internal hemorrhoids that may account for the acute bleeding as well as the infrequent episodes of scant bloody mucoid discharge.  *  Multiple sclerosis.    PLAN:     *  Will discuss case with Dr. Havery Moros. If there are no plans for colonoscopy tomorrow, will advance diet. If there are no plans for colonoscopy, suspect we might be able to discharge her later today or early tomorrow   *  Stop Protonix.  Not clear that she needs antibiotics.      Azucena Freed  06/27/2015, 10:23 AM Pager: [phone]

## 2015-06-27 NOTE — Progress Notes (Signed)
Pharmacy Antibiotic Note  Christina Carey is a 60 y.o. female admitted on 06/26/2015 with intra-abdominal infection.  Pharmacy has been consulted for Cipro dosing. WBC WNL. Renal function good.   Plan: -Cipro 400 mg IV q12h -Flagyl per MD -Trend WBC, temp, renal function  -F/U infectious work-up  Temp (24hrs), Avg:98.1 F (36.7 C), Min:98 F (36.7 C), Max:98.1 F (36.7 C)   Recent Labs Lab 06/26/15 1538  WBC 6.9  CREATININE 0.80    CrCl cannot be calculated (Unknown ideal weight.).    Allergies  Allergen Reactions  . Sulfamethoxazole-Trimethoprim     REACTION: rash, taking trimethoprim now and is not having a problem. 01-05-15.   Narda Bonds 06/27/2015 12:41 AM

## 2015-06-27 NOTE — H&P (Signed)
History and Physical  Christina Carey [id number] DOB: 1955/08/21 DOA: 06/26/2015  PCP: Thressa Sheller, MD   Chief Complaint: diarrhea   History of Present Illness:  Patient is a 60 yo female with history of HTN and MS who came with cc of diarrhea, watery to soft stool, started yesterday, associated with cramping diffuse abdominal pain L>R, without N/V, with passing red blood (hematochezia) for the last few BMs last night. She denies cough, dyspnea, chest pain, fever, chills, dysuria. No other complaints.   Review of Systems:  CONSTITUTIONAL:  No night sweats.  No fatigue, malaise, lethargy.  No fever or chills. Eyes:  No visual changes.  No eye pain.  No eye discharge.   ENT:    No epistaxis.  No sinus pain.  No sore throat.  No ear pain.  No congestion. RESPIRATORY:  No cough.  No wheeze.  No hemoptysis.  No shortness of breath. CARDIOVASCULAR:  No chest pains.  No palpitations. GASTROINTESTINAL:  +abdominal pain.  No nausea or vomiting.  +diarrhea .  No hematemesis.  +hematochezia.  No melena. GENITOURINARY:  No urgency.  No frequency.  No dysuria.  No hematuria.  No obstructive symptoms.  No discharge.  No pain.  No significant abnormal bleeding. MUSCULOSKELETAL:  No musculoskeletal pain.  No joint swelling.  No arthritis. NEUROLOGICAL:  No confusion.  No weakness. No headache. No seizure. PSYCHIATRIC:  No depression. No anxiety. No suicidal ideation. SKIN:  No rashes.  No lesions.  No wounds. ENDOCRINE:  No unexplained weight loss.  No polydipsia.  No polyuria.  No polyphagia. HEMATOLOGIC:  No anemia.  No purpura.  No petechiae.  No bleeding.  ALLERGIC AND IMMUNOLOGIC:  No pruritus.  No swelling Other:  Past Medical and Surgical History:   Past Medical History  Diagnosis Date  . High blood pressure   . Thyroid disorder   . MS (multiple sclerosis) ([place])    History reviewed. No pertinent past surgical history.  Social History:   reports that she has never  smoked. She has never used smokeless tobacco. She reports that she does not drink alcohol or use illicit drugs.   Allergies  Allergen Reactions  . Sulfamethoxazole-Trimethoprim     REACTION: rash, taking trimethoprim now and is not having a problem. 01-05-15.    Family History  Problem Relation Age of Onset  . Cancer Mother   . Cancer Father   . Cancer Maternal Grandmother       Prior to Admission medications   Medication Sig Start Date End Date Taking? Authorizing Provider  amLODipine (NORVASC) 5 MG tablet Take 5 mg by mouth daily.  02/08/13  Yes Historical Provider, MD  AVONEX PEN 30 MCG/0.5ML AJKT INJECT 30MCG IM WEEKLY:HOLD PEN FIRMLY AGAINST THIGH AND PRESS BLUE BUTTON. HOLD IN PLACE FOR 10 SECONDS. REFRIGERATE. ALLOW TO WARM TO ROOM 06/23/14  Yes Kathrynn Ducking, MD  Calcium Carbonate-Vitamin D (CALCIUM + D PO) Take 600 mg by mouth daily.   Yes Historical Provider, MD  Cinnamon 500 MG TABS Take 500 mg by mouth 2 (two) times daily.   Yes Historical Provider, MD  levothyroxine (SYNTHROID, LEVOTHROID) 75 MCG tablet 1 TABLET ONCE A DAY ORALLY 90 DAYS 12/22/14  Yes Historical Provider, MD  nystatin-triamcinolone (MYCOLOG II) cream Apply 1 application topically daily as needed (irration).  03/18/14  Yes Historical Provider, MD  simvastatin (ZOCOR) 5 MG tablet Take 5 mg by mouth every evening.  07/16/12  Yes Historical Provider, MD  trandolapril (MAVIK) 4 MG tablet  Take 4 mg by mouth daily. 05/09/14  Yes Historical Provider, MD  trimethoprim (TRIMPEX) 100 MG tablet Take 100 mg by mouth daily as needed (UTI).  12/31/14  Yes Historical Provider, MD    Physical Exam: BP 147/84 mmHg  Pulse 66  Temp(Src) 98 F (36.7 C) (Oral)  Resp 16  SpO2 98%  GENERAL : Well developed, well nourished, alert and cooperative, and appears to be in no acute distress. HEAD: normocephalic. NOSE: No nasal discharge. THROAT: Oral cavity and pharynx normal. NECK: Neck supple CARDIAC: Normal S1 and S2. No S3,  S4 or murmurs. Rhythm is regular. There is no peripheral edema LUNGS: Clear to auscultation  ABDOMEN: Positive bowel sounds. Soft, nondistended, diffuse tenderness to palpation L>>R. No guarding or rebound. No masses. NEUROLOGICAL: The mental examination revealed the patient was oriented to person, place, and time.CN II-XII intact.  SKIN: Skin normal color PSYCHIATRIC:  The patient was able to demonstrate good judgement and reason, without hallucinations, abnormal affect or abnormal behaviors during the examination. Patient is not suicidal.          Labs on Admission:  Reviewed.   Radiological Exams on Admission: Ct Abdomen Pelvis W Contrast  06/26/2015  CLINICAL DATA:  Diarrhea 2 days with bright-red blood per rectum and lower abdominal cramping. EXAM: CT ABDOMEN AND PELVIS WITH CONTRAST TECHNIQUE: Multidetector CT imaging of the abdomen and pelvis was performed using the standard protocol following bolus administration of intravenous contrast. CONTRAST:  166mL OMNIPAQUE IOHEXOL 300 MG/ML  SOLN COMPARISON:  Lumbar spine 01/18/2010 FINDINGS: Lung bases are within normal. Abdominal images demonstrate the liver, spleen, gallbladder, pancreas and adrenal glands to be within normal. The stomach is normal. Kidneys are normal in size without hydronephrosis or nephrolithiasis. There are a couple small parapelvic left renal cysts. Ureters are within normal. Appendix is normal. There is moderate circumferential wall thickening of the mid to distal aspect of the descending colon with mild adjacent inflammatory change in the pericolonic fat. Findings compatible with acute colitis. No evidence of perforation or abscess. Secondary inflammation of an epiploic appendage anterior to the distal aspect of the descending colon in the left lower quadrant. Small bowel is within normal. Subtle hazy attenuation of the mid mesentery which may be due to mesenteric panniculitis. Few small associated mesenteric lymph nodes. Mild  calcified plaque over the abdominal aorta and iliac arteries. Pelvic images demonstrate the uterus, ovaries, bladder and rectum to be within normal. There mild degenerate changes of the spine and hips. Mild stable compression deformity of L1. IMPRESSION: Evidence of acute colitis involving the mid to distal aspect of the descending colon. No perforation or abscess. Secondary inflammation of an epiploic appendage adjacent the distal aspect of the descending colon. Several parapelvic left renal cysts. Hazy attenuation of the mid mesentery with a few small associated mesenteric lymph nodes suggesting mild mesenteric panniculitis. Stable mild L1 compression deformity. Electronically Signed   By: Marin Olp M.D.   On: 06/26/2015 20:50      Assessment/Plan  Colitis:  Started on Cipro/Flagyl Unlikely to be c.diff Morphine prn pain NPO IVF @ 100 cc/hr  Hematochezia:  Due to above probably Hb stable Repeat H/H in am Last colonoscopy in 2009 was WNL No melena, hematemesis   HTN: cont home meds  MS: cont home meds  DVT prophylaxis: [place] enoxaparin  GI prophylaxis: PPI  Code Status: Full    Gennaro Africa M.D [facility]

## 2015-06-27 NOTE — Progress Notes (Signed)
[facility] PROGRESS NOTE  Christina Carey [id number] DOB: [birth year] DOA: 06/26/2015 PCP: Christina Sheller, MD  Assessment/Plan: Patient is a 60 yo female with history of HTN and MS who came with cc of diarrhea, watery to soft stool, started yesterday, associated with cramping diffuse abdominal pain L>R, without N/V, with passing red blood (hematochezia) for the last few BMs last night. She denies cough, dyspnea, chest pain, fever, chills, dysuria. No other complaints.   1-Colitis;  Continue with cipro and flagyl.  She is immunocompromise,. Recommend GI pathogen.. Defer to GI, PA will ask her attending.  GI was consulted.  Clear diet.   2-Hematochezia;  Suspect related to colitis. HB has remain stable.  IV protonix.   3-DVT prophylaxis: hold lovenox due to hematochezia. Will order SCD.    4-HTN; hold BP medications in setting of bleeding.  5-MS; On interferon   Code Status: Full code.  Family Communication: care discussed with patient.  Disposition Plan: Remain inpatient    Consultants:  GI  Procedures:  none  Antibiotics:  Ciprofloxacin  Flagyl   HPI/Subjective: Report multiple bloody Bowel movements, was having diarrhea, although now is just blooding per rectum.   Objective: Filed Vitals:   06/27/15 0153 06/27/15 0300  BP: 140/82 138/77  Pulse: 61 58  Temp: 98.1 F (36.7 C) 97.9 F (36.6 C)  Resp: 18 16    Intake/Output Summary (Last 24 hours) at 06/27/15 1029 Last data filed at 06/27/15 0555  Gross per 24 hour  Intake      0 ml  Output    500 ml  Net   -500 ml   There were no vitals filed for this visit.  Exam:   General:  NAD  Cardiovascular: S 1, S 2 RRR  Respiratory: CTA  Abdomen: BS present, soft, mild left lower quadrant tenderness  Musculoskeletal: no edema  Data Reviewed: Basic Metabolic Panel:  Recent Labs Lab 06/26/15 1538  NA 140  K 3.6  CL 104  CO2 24  GLUCOSE 97  BUN 7  CREATININE 0.80  CALCIUM 9.3    Liver Function Tests:  Recent Labs Lab 06/26/15 1538  AST 22  ALT 19  ALKPHOS 57  BILITOT 0.5  PROT 7.0  ALBUMIN 3.9    Recent Labs Lab 06/26/15 1538  LIPASE 29   No results for input(s): AMMONIA in the last 168 hours. CBC:  Recent Labs Lab 06/26/15 1538 06/27/15 0546  WBC 6.9 5.9  NEUTROABS  --  3.5  HGB 13.7 13.3  HCT 43.4 40.6  MCV 87.5 88.1  PLT 292 224   Cardiac Enzymes: No results for input(s): CKTOTAL, CKMB, CKMBINDEX, TROPONINI in the last 168 hours. BNP (last 3 results) No results for input(s): BNP in the last 8760 hours.  ProBNP (last 3 results) No results for input(s): PROBNP in the last 8760 hours.  CBG: No results for input(s): GLUCAP in the last 168 hours.  Recent Results (from the past 240 hour(s))  C difficile quick scan w PCR reflex     Status: None   Collection Time: 06/26/15 10:40 PM  Result Value Ref Range Status   C Diff antigen NEGATIVE NEGATIVE Final   C Diff toxin NEGATIVE NEGATIVE Final   C Diff interpretation Negative for toxigenic C. difficile  Final     Studies: Ct Abdomen Pelvis W Contrast  06/26/2015  CLINICAL DATA:  Diarrhea 2 days with bright-red blood per rectum and lower abdominal cramping. EXAM: CT ABDOMEN AND PELVIS WITH CONTRAST TECHNIQUE: Multidetector  CT imaging of the abdomen and pelvis was performed using the standard protocol following bolus administration of intravenous contrast. CONTRAST:  126mL OMNIPAQUE IOHEXOL 300 MG/ML  SOLN COMPARISON:  Lumbar spine 01/18/2010 FINDINGS: Lung bases are within normal. Abdominal images demonstrate the liver, spleen, gallbladder, pancreas and adrenal glands to be within normal. The stomach is normal. Kidneys are normal in size without hydronephrosis or nephrolithiasis. There are a couple small parapelvic left renal cysts. Ureters are within normal. Appendix is normal. There is moderate circumferential wall thickening of the mid to distal aspect of the descending colon with mild  adjacent inflammatory change in the pericolonic fat. Findings compatible with acute colitis. No evidence of perforation or abscess. Secondary inflammation of an epiploic appendage anterior to the distal aspect of the descending colon in the left lower quadrant. Small bowel is within normal. Subtle hazy attenuation of the mid mesentery which may be due to mesenteric panniculitis. Few small associated mesenteric lymph nodes. Mild calcified plaque over the abdominal aorta and iliac arteries. Pelvic images demonstrate the uterus, ovaries, bladder and rectum to be within normal. There mild degenerate changes of the spine and hips. Mild stable compression deformity of L1. IMPRESSION: Evidence of acute colitis involving the mid to distal aspect of the descending colon. No perforation or abscess. Secondary inflammation of an epiploic appendage adjacent the distal aspect of the descending colon. Several parapelvic left renal cysts. Hazy attenuation of the mid mesentery with a few small associated mesenteric lymph nodes suggesting mild mesenteric panniculitis. Stable mild L1 compression deformity. Electronically Signed   By: Marin Olp M.D.   On: 06/26/2015 20:50    Scheduled Meds: . ciprofloxacin  400 mg Intravenous Q12H  . levothyroxine  75 mcg Oral QAC breakfast  . metronidazole  500 mg Intravenous Q8H  . pantoprazole (PROTONIX) IV  40 mg Intravenous Q24H  . simvastatin  5 mg Oral QPM   Continuous Infusions: . dextrose 5 % and 0.45% NaCl 100 mL/hr at 06/27/15 [id number]    Active Problems:   Colitis    Time spent: 35 minutes    Christina Carey A  [facility] Pager [phone] If 7PM-7AM, please contact night-coverage at [email or web address], password [facility] 06/27/2015, 10:29 AM  LOS: 0 days

## 2015-06-28 ENCOUNTER — Telehealth: Payer: Self-pay | Admitting: *Deleted

## 2015-06-28 ENCOUNTER — Telehealth: Payer: Self-pay | Admitting: Neurology

## 2015-06-28 NOTE — Telephone Encounter (Signed)
-----   Message from Manus Gunning, MD sent at 06/27/2015  4:39 PM EST ----- [facility], This patient is being discharged and needs a follow up colonoscopy with me in 4-6 weeks. Can you help coordinate with her and follow up with her tomorrow on the phone? She should be discharged tonight. Thanks

## 2015-06-28 NOTE — Telephone Encounter (Signed)
Pt called and needs to speak with the nurse about taking her next dose of AVONEX PEN 30 MCG/0.5ML AJKT. She was recently seen in the ER for Colitis. The ER nurse told her that she needed to check with the nurse before she took it again. She is due to have next injection tomorrow night. Please call and advise [phone]

## 2015-06-28 NOTE — Telephone Encounter (Signed)
I called the patient. The patient was in the hospital with bloody diarrhea, etiology not clear. She is on antibiotics. They are concerned about immunosuppression on the Avonex, I'm not sure that this is a significant concern, okay to continue Avonex for now.

## 2015-06-28 NOTE — Telephone Encounter (Signed)
Christina Carey with [employer] [phone] opt 2 REF # [id number] called sts someone at [facility] cancelled the PA  by mistake. He said it will now require a new PA which will mostly likely be denied and then will go thru an appeal process.

## 2015-06-28 NOTE — Telephone Encounter (Signed)
Left a message for patient to call back. 

## 2015-06-29 NOTE — Telephone Encounter (Signed)
Spoke with patient and gave her recommendations. Scheduled prop colonoscopy on 08/05/15 at 2:00 PM and pre visit on 07/29/15 at 8:30 AM.

## 2015-06-29 NOTE — Telephone Encounter (Signed)
I tried to call Jimmie Molly. I was transferred to his line and phone rang with no voicemail. Will try again. I had received notification about 2 weeks ago that a PA was not needed for Avonex for this patient, if this is the medication he is calling about. It is the only PA I have done for this patient.

## 2015-06-29 NOTE — Telephone Encounter (Signed)
She needs a colonoscopy in 4-6 weeks. If she is feeling well she doesn't need an office with [place]. I think she was supposed to see her primary care in one week. If she is feeling poorly however, we can see her. Thanks

## 2015-06-29 NOTE — Telephone Encounter (Signed)
I called [employer] and spoke to [place]. I advised that when I completed the PA I received a response that the PA was not needed. I advised that I completed the PA on CoverMyMeds and the key is [id number]. She stated they will call back if they need any further information from [place].

## 2015-06-29 NOTE — Telephone Encounter (Signed)
Patient called and states her discharge instructions said to have a follow up OV in 1 week. Does she need to do this or just schedule procedure in 4-6 weeks?

## 2015-07-01 ENCOUNTER — Encounter: Payer: Self-pay | Admitting: Gastroenterology

## 2015-07-05 ENCOUNTER — Encounter: Payer: Self-pay | Admitting: Nurse Practitioner

## 2015-07-05 ENCOUNTER — Ambulatory Visit (INDEPENDENT_AMBULATORY_CARE_PROVIDER_SITE_OTHER): Payer: Commercial Managed Care - PPO | Admitting: Nurse Practitioner

## 2015-07-05 VITALS — BP 99/74 | HR 79 | Ht 67.0 in | Wt 200.2 lb

## 2015-07-05 DIAGNOSIS — G35 Multiple sclerosis: Secondary | ICD-10-CM

## 2015-07-05 NOTE — Progress Notes (Signed)
I have read the note, and I agree with the clinical assessment and plan.  WILLIS,CHARLES KEITH   

## 2015-07-05 NOTE — Patient Instructions (Signed)
Continue Avonex weekly patient obtained through patient assistance Labs reviewed from hospital admission and normal F/U in 6 months

## 2015-07-05 NOTE — Progress Notes (Signed)
[facility]  PATIENT: DAUN PALMERTREE DOB: 04/26/1956   REASON FOR VISIT: Follow-up for multiple sclerosis HISTORY FROM: Patient    HISTORY OF PRESENT ILLNESS:Ms. Torbert, 60 year old female returns for followup. She was last seen 01/05/15. She follows up for multiple sclerosis and is currently on Avonex. She had side effects to tecfidera. She denies any double vision blurred vision, falls, weakness, speech or swallowing difficulties. Last MRI of the brain 06/28/14 ,  abnormal MRI of the brain with and without contrast showing foci within the white matter in a pattern and configuration consistent with the clinical diagnosis of multiple sclerosis. None of the foci appears to be acute.She obtains routine labs at primary care to include liver function however she had recent ER admission for rectal bleeding and was diagnosed with colitis. She has not followed up with GI. She returns for reevaluation.     HISTORY: She has a history of multiple sclerosis. She was on Betaseron but was tired of shots at her last visit and was switched to tecfidera. When she eats a full meal with the medication she has much less flushing and abdominal symptoms however if she eats a light meal her flushing is worse. Most recent MRI 05/29/2012 with previous MS lesions not seen. She continues to have numbness which is intermittent on the top of the foot and she also has a pressure area over the third toe on the right with significant corns on the bottom of both feet. She denies any other sensory changes, focal weakness, speech or swallowing difficulty, double vision, blurred vision, balance issues   REVIEW OF SYSTEMS: Full 14 system review of systems performed and notable only for those listed, all others are neg:  Constitutional: neg  Cardiovascular: neg Ear/Nose/Throat: neg  Skin: neg Eyes: neg Respiratory: neg Gastroitestinal: Swollen abdomen, abdominal pain rectal bleeding,  diarrhea Hematology/Lymphatic: neg  Endocrine: neg Musculoskeletal:neg Allergy/Immunology: neg Neurological: neg Psychiatric: neg Sleep : neg   ALLERGIES: Allergies  Allergen Reactions  . Sulfamethoxazole-Trimethoprim     REACTION: rash, taking trimethoprim now and is not having a problem. 01-05-15.    HOME MEDICATIONS: Outpatient Prescriptions Prior to Visit  Medication Sig Dispense Refill  . AVONEX PEN 30 MCG/0.5ML AJKT INJECT 30MCG IM WEEKLY:HOLD PEN FIRMLY AGAINST THIGH AND PRESS BLUE BUTTON. HOLD IN PLACE FOR 10 SECONDS. REFRIGERATE. ALLOW TO WARM TO ROOM 1 each 6  . Calcium Carbonate-Vitamin D (CALCIUM + D PO) Take 600 mg by mouth daily.    . Cinnamon 500 MG TABS Take 500 mg by mouth 2 (two) times daily.    Marland Kitchen levothyroxine (SYNTHROID, LEVOTHROID) 75 MCG tablet 1 TABLET ONCE A DAY ORALLY 90 DAYS  1  . nystatin-triamcinolone (MYCOLOG II) cream Apply 1 application topically daily as needed (irration).   1  . simvastatin (ZOCOR) 5 MG tablet Take 5 mg by mouth every evening.     . trandolapril (MAVIK) 4 MG tablet Take 4 mg by mouth daily.  1  . ciprofloxacin (CIPRO) 500 MG tablet Take 1 tablet (500 mg total) by mouth 2 (two) times daily. (Patient not taking: Reported on 07/05/2015) 14 tablet 0  . metroNIDAZOLE (FLAGYL) 500 MG tablet Take 1 tablet (500 mg total) by mouth 3 (three) times daily. 21 tablet 0   No facility-administered medications prior to visit.    PAST MEDICAL HISTORY: Past Medical History  Diagnosis Date  . High blood pressure   . Thyroid disorder   . MS (multiple sclerosis) ([place])  PAST SURGICAL HISTORY: No past surgical history on file.  FAMILY HISTORY: Family History  Problem Relation Age of Onset  . Cancer Mother   . Cancer Father   . Cancer Maternal Grandmother     SOCIAL HISTORY: Social History   Social History  . Marital Status: Married    Spouse Name: Purcell Nails  . Number of Children: 2  . Years of Education: 16   Occupational History   . ACCOUNTING    Social History Main Topics  . Smoking status: Never Smoker   . Smokeless tobacco: Never Used  . Alcohol Use: No     Comment: caffeine drinks 1-2 daily  . Drug Use: No  . Sexual Activity: Not on file   Other Topics Concern  . Not on file   Social History Narrative   Patient lives at home with her husband Farrel Gordon. and they have 2 adult children. 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    Patient is right- handed.   Patient drinks one cup of coffee daily and soda very rarely.     PHYSICAL EXAM  Filed Vitals:   07/05/15 0751  BP: 99/74  Pulse: 79  Height: 5\' 7"  (1.702 m)  Weight: 200 lb 3.2 oz (90.81 kg)   Body mass index is 31.35 kg/(m^2). Generalized: Well developed, obese female in no acute distress  Head: normocephalic and atraumatic,. Oropharynx benign  Neck: Supple, no carotid bruits  Cardiac: Regular rate rhythm, no murmur  Musculoskeletal: No deformity   Neurological examination   Mentation: Alert oriented to time, place, history taking. Follows all commands speech and language fluent  Cranial nerve II-XII: Visual acuity 20/30 bilaterally .Fundoscopic exam reveals sharp disc margins.Pupils were equal round reactive to light extraocular movements were full, visual field were full on confrontational test. Facial sensation and strength were normal. hearing was intact to finger rubbing bilaterally. Uvula tongue midline. head turning and shoulder shrug were normal and symmetric.Tongue protrusion into cheek strength was normal. Motor: normal bulk and tone, full strength in the BUE, BLE, No focal weakness Sensory: normal and symmetric to light touch, pinprick, and vibration  Coordination: finger-nose-finger, heel-to-shin bilaterally, no dysmetria Reflexes: Brachioradialis 2/2, biceps 2/2, triceps 2/2, patellar 2/2, Achilles 2/2, plantar responses were flexor bilaterally. Gait and Station: Rising up from seated position  without assistance, normal stance, moderate stride, good arm swing, smooth turning, able to perform tiptoe, and heel walking without difficulty. Tandem gait is steady DIAGNOSTIC DATA (LABS, IMAGING, TESTING) - I reviewed patient records, labs, notes, testing and imaging myself where available.  Lab Results  Component Value Date   WBC 5.9 06/27/2015   HGB 13.3 06/27/2015   HCT 40.6 06/27/2015   MCV 88.1 06/27/2015   PLT 224 06/27/2015      Component Value Date/Time   NA 140 06/26/2015 1538   K 3.6 06/26/2015 1538   CL 104 06/26/2015 1538   CO2 24 06/26/2015 1538   GLUCOSE 97 06/26/2015 1538   BUN 7 06/26/2015 1538   CREATININE 0.80 06/26/2015 1538   CALCIUM 9.3 06/26/2015 1538   PROT 7.0 06/26/2015 1538   ALBUMIN 3.9 06/26/2015 1538   AST 22 06/26/2015 1538   ALT 19 06/26/2015 1538   ALKPHOS 57 06/26/2015 1538   BILITOT 0.5 06/26/2015 1538   GFRNONAA >60 06/26/2015 1538   GFRAA >60 06/26/2015 1538    ASSESSMENT AND PLAN 60 y.o. year old female has a past medical history of High blood pressure; Thyroid disorder;  and MS (multiple sclerosis). here to follow-up. She is currently on Avonex weekly IM without exacerbation of her MS symptoms. Recent ER visit for  rectal bleeding, diagnosed with colitis MRI of the brain 06/28/14 This is an abnormal MRI of the brain with and without contrast showing foci within the white matter in a pattern and configuration consistent with the clinical diagnosis of multiple sclerosis. None of the foci appears to be acute.  PLAN: Continue Avonex weekly patient obtained through patient assistance Reviewed recent labs from the hospital 06/26/2015 within normal limits  Follow-up in 6 months Call for any exacerbation of symptoms Answered additional questions about continuing Avonex. Dennie Bible, [facility], APRN  [facility] [address], [place], [place] [address] [phone]

## 2015-07-28 ENCOUNTER — Telehealth: Payer: Self-pay | Admitting: Neurology

## 2015-07-28 NOTE — Telephone Encounter (Signed)
LMVM for Christina Carey that from last prescription noted it is AVONEX PEN as per below states.  If she has any other questions to let us know.

## 2015-07-28 NOTE — Telephone Encounter (Signed)
Christina Carey with Home script [phone] called inquiring if pt is on AVONEX PEN 30 MCG/0.5ML AJKT pen or syringe. She said this urgent request, pt is out.

## 2015-07-29 ENCOUNTER — Ambulatory Visit (AMBULATORY_SURGERY_CENTER): Payer: Self-pay

## 2015-07-29 VITALS — Ht 67.0 in | Wt 201.6 lb

## 2015-07-29 DIAGNOSIS — K529 Noninfective gastroenteritis and colitis, unspecified: Secondary | ICD-10-CM

## 2015-07-29 MED ORDER — SUPREP BOWEL PREP KIT 17.5-3.13-1.6 GM/177ML PO SOLN
1.0000 | Freq: Once | ORAL | Status: DC
Start: 2015-07-29 — End: 2015-08-05

## 2015-07-29 NOTE — Progress Notes (Signed)
No allergies to eggs or soy No diet/weight loss meds No family hx colon CA No past problems with anesthesia No home oxygen  Has email and internet; refused emmi

## 2015-08-05 ENCOUNTER — Ambulatory Visit (AMBULATORY_SURGERY_CENTER): Payer: Commercial Managed Care - PPO | Admitting: Gastroenterology

## 2015-08-05 ENCOUNTER — Encounter: Payer: Self-pay | Admitting: Gastroenterology

## 2015-08-05 VITALS — BP 125/72 | HR 70 | Temp 98.0°F | Resp 12 | Ht 67.0 in | Wt 201.0 lb

## 2015-08-05 DIAGNOSIS — K529 Noninfective gastroenteritis and colitis, unspecified: Secondary | ICD-10-CM

## 2015-08-05 DIAGNOSIS — K635 Polyp of colon: Secondary | ICD-10-CM

## 2015-08-05 DIAGNOSIS — R933 Abnormal findings on diagnostic imaging of other parts of digestive tract: Secondary | ICD-10-CM

## 2015-08-05 DIAGNOSIS — D125 Benign neoplasm of sigmoid colon: Secondary | ICD-10-CM

## 2015-08-05 MED ORDER — SODIUM CHLORIDE 0.9 % IV SOLN: 500.0000 mL | INTRAVENOUS | Status: DC

## 2015-08-05 NOTE — Progress Notes (Signed)
Patient awakening,vss,report to rn 

## 2015-08-05 NOTE — Patient Instructions (Signed)
YOU HAD AN ENDOSCOPIC PROCEDURE TODAY AT [facility]:   Refer to the procedure report that was given to you for any specific questions about what was found during the examination.  If the procedure report does not answer your questions, please call your gastroenterologist to clarify.  If you requested that your care partner not be given the details of your procedure findings, then the procedure report has been included in a sealed envelope for you to review at your convenience later.  YOU SHOULD EXPECT: Some feelings of bloating in the abdomen. Passage of more gas than usual.  Walking can help get rid of the air that was put into your GI tract during the procedure and reduce the bloating. If you had a lower endoscopy (such as a colonoscopy or flexible sigmoidoscopy) you may notice spotting of blood in your stool or on the toilet paper. If you underwent a bowel prep for your procedure, you may not have a normal bowel movement for a few days.  Please Note:  You might notice some irritation and congestion in your nose or some drainage.  This is from the oxygen used during your procedure.  There is no need for concern and it should clear up in a day or so.  SYMPTOMS TO REPORT IMMEDIATELY:   Following lower endoscopy (colonoscopy or flexible sigmoidoscopy):  Excessive amounts of blood in the stool  Significant tenderness or worsening of abdominal pains  Swelling of the abdomen that is new, acute  Fever of 100F or higher   For urgent or emergent issues, a gastroenterologist can be reached at any hour by calling [phone].   DIET: Your first meal following the procedure should be a small meal and then it is ok to progress to your normal diet. Heavy or fried foods are harder to digest and may make you feel nauseous or bloated.  Likewise, meals heavy in dairy and vegetables can increase bloating.  Drink plenty of fluids but you should avoid  alcoholic beverages for 24 hours.  ACTIVITY:  You should plan to take it easy for the rest of today and you should NOT DRIVE or use heavy machinery until tomorrow (because of the sedation medicines used during the test).    FOLLOW UP: Our staff will call the number listed on your records the next business day following your procedure to check on you and address any questions or concerns that you may have regarding the information given to you following your procedure. If we do not reach you, we will leave a message.  However, if you are feeling well and you are not experiencing any problems, there is no need to return our call.  We will assume that you have returned to your regular daily activities without incident.  If any biopsies were taken you will be contacted by phone or by letter within the next 1-3 weeks.  Please call us at [phone] if you have not heard about the biopsies in 3 weeks.    SIGNATURES/CONFIDENTIALITY: You and/or your care partner have signed paperwork which will be entered into your electronic medical record.  These signatures attest to the fact that that the information above on your After Visit Summary has been reviewed and is understood.  Full responsibility of the confidentiality of this discharge information lies with you and/or your care-partner.  Polyp information given. No aspirin or nonsteroidal anti inflammatory medications for 2 weeks.  Tylenol OK

## 2015-08-05 NOTE — Op Note (Signed)
[facility] Patient Name: Christina Carey Procedure Date: 08/05/2015 1:37 PM MRN: [id number] Endoscopist: Remo Lipps P. Havery Moros , MD Age: 60 Referring MD:  Date of Birth: [birth year] Gender: Female Procedure:                Colonoscopy Indications:              Abnormal CT of the GI tract, recent hospitalization                            concerning for possible ischemic colitis vs.                            infectious colitis, here for follow up colonoscopy Medicines:                Monitored Anesthesia Care Procedure:                Pre-Anesthesia Assessment:                           - Prior to the procedure, a History and Physical                            was performed, and patient medications and                            allergies were reviewed. The patient's tolerance of                            previous anesthesia was also reviewed. The risks                            and benefits of the procedure and the sedation                            options and risks were discussed with the patient.                            All questions were answered, and informed consent                            was obtained. Prior Anticoagulants: The patient has                            taken no previous anticoagulant or antiplatelet                            agents. ASA Grade Assessment: II - A patient with                            mild systemic disease. After reviewing the risks                            and benefits, the patient was deemed in  satisfactory condition to undergo the procedure.                           After obtaining informed consent, the colonoscope                            was passed under direct vision. Throughout the                            procedure, the patient's blood pressure, pulse, and                            oxygen saturations were monitored continuously. The                            Model CF-HQ190L  [id number]) scope was introduced                            through the anus and advanced to the the terminal                            ileum, with identification of the appendiceal                            orifice and IC valve. The colonoscopy was performed                            without difficulty. The patient tolerated the                            procedure well. The quality of the bowel                            preparation was adequate. The terminal ileum,                            ileocecal valve, appendiceal orifice, and rectum                            were photographed. Scope In: 1:44:03 PM Scope Out: 1:59:44 PM Scope Withdrawal Time: 0 hours 12 minutes 53 seconds  Total Procedure Duration: 0 hours 15 minutes 41 seconds  Findings:      The perianal and digital rectal examinations were normal.      A 4 mm polyp was found in the sigmoid colon. The polyp was sessile. The       polyp was removed with a cold snare. Resection and retrieval were       complete.      Multiple flat polyps were found in the rectum, recto-sigmoid colon and       sigmoid colon, consistent with benign hyperplastic polyps. The polyps       were diminutive in size.      Anal papilla(e) were hypertrophied.      The terminal ileum appeared normal.      The exam was otherwise without abnormality. No pathology was noted  to       account for prior CT findings, which I suspect was due to ischemic       colitis vs. infectious colitis. Complications:            No immediate complications. Estimated blood loss:                            Minimal. Estimated Blood Loss:     Estimated blood loss was minimal. Impression:               - One 4 mm polyp in the sigmoid colon, removed with                            a cold snare. Resected and retrieved.                           - Multiple diminutive polyps in the rectum, at the                            recto-sigmoid colon and in the sigmoid colon.                            - Anal papilla(e) were hypertrophied.                           - The examined portion of the ileum was normal.                           - The examination was otherwise normal. Recommendation:           - Patient has a contact number available for                            emergencies. The signs and symptoms of potential                            delayed complications were discussed with the                            patient. Return to normal activities tomorrow.                            Written discharge instructions were provided to the                            patient.                           - Resume previous diet.                           - Continue present medications.                           - Await pathology results.                           -  Repeat colonoscopy is recommended for                            surveillance. The colonoscopy date will be                            determined after pathology results from today's                            exam become available for review. Procedure Code(s):        --- Professional ---                           [phone], Colonoscopy, flexible; with removal of                            tumor(s), polyp(s), or other lesion(s) by snare                            technique CPT copyright 2016 American Medical Association. All rights reserved. Remo Lipps P. Sincere Liuzzi, MD 08/05/2015 2:04:30 PM This report has been signed electronically. Number of Addenda: 0 Referring MD:      Zada Girt

## 2015-08-05 NOTE — Progress Notes (Signed)
Called to room to assist during endoscopic procedure.  Patient ID and intended procedure confirmed with present staff. Received instructions for my participation in the procedure from the performing physician.  

## 2015-08-08 ENCOUNTER — Telehealth: Payer: Self-pay | Admitting: *Deleted

## 2015-08-08 NOTE — Telephone Encounter (Signed)
  Follow up Call-  Call back number 08/05/2015  Post procedure Call Back phone  # [phone]  Permission to leave phone message Yes     No answer, left message.

## 2015-08-12 ENCOUNTER — Encounter: Payer: Self-pay | Admitting: Gastroenterology

## 2016-01-02 ENCOUNTER — Encounter: Payer: Self-pay | Admitting: Nurse Practitioner

## 2016-01-02 ENCOUNTER — Ambulatory Visit (INDEPENDENT_AMBULATORY_CARE_PROVIDER_SITE_OTHER): Payer: Commercial Managed Care - PPO | Admitting: Nurse Practitioner

## 2016-01-02 VITALS — BP 126/88 | HR 64 | Ht 67.0 in | Wt 206.8 lb

## 2016-01-02 DIAGNOSIS — G35 Multiple sclerosis: Secondary | ICD-10-CM | POA: Diagnosis not present

## 2016-01-02 MED ORDER — AVONEX PEN 30 MCG/0.5ML IM AJKT: 30.0000 ug | AUTO-INJECTOR | INTRAMUSCULAR | 6 refills | Status: DC

## 2016-01-02 NOTE — Progress Notes (Signed)
I have read the note, and I agree with the clinical assessment and plan.  Wilber Fini KEITH   

## 2016-01-02 NOTE — Progress Notes (Signed)
[facility]  PATIENT: Christina Carey DOB: 1955-10-05   REASON FOR VISIT: Follow-up for multiple sclerosis HISTORY FROM: Patient    HISTORY OF PRESENT ILLNESS:Christina Carey, 60 year-old female returns for followup. She was last seen 07/05/15.  She follows up for multiple sclerosis and is currently on Avonex. She had side effects to tecfidera. She denies any double vision blurred vision, falls, weakness, speech or swallowing difficulties. Last MRI of the brain 06/28/14 ,  abnormal MRI of the brain with and without contrast showing foci within the white matter in a pattern and configuration consistent with the clinical diagnosis of multiple sclerosis. None of the foci appears to be acute.She obtains routine labs at primary care to include liver function  She has an appointment in the next month. She returns for reevaluation.     HISTORY: She has a history of multiple sclerosis. She was on Betaseron but was tired of shots at her last visit and was switched to tecfidera. When she eats a full meal with the medication she has much less flushing and abdominal symptoms however if she eats a light meal her flushing is worse. Most recent MRI 05/29/2012 with previous MS lesions not seen. She continues to have numbness which is intermittent on the top of the foot and she also has a pressure area over the third toe on the right with significant corns on the bottom of both feet. She denies any other sensory changes, focal weakness, speech or swallowing difficulty, double vision, blurred vision, balance issues   REVIEW OF SYSTEMS: Full 14 system review of systems performed and notable only for those listed, all others are neg:  Constitutional: neg  Cardiovascular: neg Ear/Nose/Throat: neg  Skin: neg Eyes: neg Respiratory: neg Gastroitestinal:  Hematology/Lymphatic: neg  Endocrine: neg Musculoskeletal:neg Allergy/Immunology: neg Neurological: neg Psychiatric: neg Sleep :  neg   ALLERGIES: Allergies  Allergen Reactions  . Sulfamethoxazole-Trimethoprim     REACTION: rash, taking trimethoprim now and is not having a problem. 01-05-15.    HOME MEDICATIONS: Outpatient Medications Prior to Visit  Medication Sig Dispense Refill  . amLODipine (NORVASC) 5 MG tablet Take 5 mg by mouth daily.  1  . AVONEX PEN 30 MCG/0.5ML AJKT INJECT 30MCG IM WEEKLY:HOLD PEN FIRMLY AGAINST THIGH AND PRESS BLUE BUTTON. HOLD IN PLACE FOR 10 SECONDS. REFRIGERATE. ALLOW TO WARM TO ROOM 1 each 6  . Calcium Carbonate-Vitamin D (CALCIUM + D PO) Take 600 mg by mouth daily.    . Cinnamon 500 MG TABS Take 500 mg by mouth 2 (two) times daily.    Marland Kitchen ESTRACE VAGINAL 0.1 MG/GM vaginal cream APPLY 1/2INCH IN VAGINAL OPENING EVERY NIGHT FOR 2WKS THEN APPLY TWICE WEEKLY UINTIL NEXT VISIT  3  . fexofenadine (ALLEGRA) 180 MG tablet Take 180 mg by mouth daily as needed.   0  . fluticasone (FLONASE) 50 MCG/ACT nasal spray INHALE 1-2 SPRAYS IN EACH NOSTRIL 2 TIMES DAILY prn  0  . levothyroxine (SYNTHROID, LEVOTHROID) 75 MCG tablet 1 TABLET ONCE A DAY ORALLY 90 DAYS  1  . nystatin-triamcinolone (MYCOLOG II) cream Apply 1 application topically daily as needed (irration).   1  . simvastatin (ZOCOR) 5 MG tablet Take 5 mg by mouth every evening.     . trandolapril (MAVIK) 4 MG tablet Take 4 mg by mouth daily.  1   No facility-administered medications prior to visit.     PAST MEDICAL HISTORY: Past Medical History:  Diagnosis Date  . Colitis 06/2015  .  High blood pressure   . MS (multiple sclerosis) ([place])   . Thyroid disorder     PAST SURGICAL HISTORY: Past Surgical History:  Procedure Laterality Date  . COLONOSCOPY    . WISDOM TOOTH EXTRACTION      FAMILY HISTORY: Family History  Problem Relation Age of Onset  . Cancer Mother   . Cancer Father   . Cancer Maternal Grandmother   . Colon cancer Neg Hx     SOCIAL HISTORY: Social History   Social History  . Marital status: Married     Spouse name: Purcell Nails  . Number of children: 2  . Years of education: 16   Occupational History  . [employer]   Social History Main Topics  . Smoking status: Never Smoker  . Smokeless tobacco: Never Used  . Alcohol use No     Comment: caffeine drinks 1-2 daily  . Drug use: No  . Sexual activity: Not on file   Other Topics Concern  . Not on file   Social History Narrative   Patient lives at home with her husband Farrel Gordon. and they have 2 adult children. 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    Patient is right- handed.   Patient drinks one cup of coffee daily and soda very rarely.     PHYSICAL EXAM  Vitals:   01/02/16 0802  BP: 126/88  Pulse: 64  Weight: 206 lb 12.8 oz (93.8 kg)  Height: 5\' 7"  (1.702 m)   Body mass index is 32.39 kg/m. Generalized: Well developed, obese female in no acute distress  Head: normocephalic and atraumatic,. Oropharynx benign  Neck: Supple, no carotid bruits  Cardiac: Regular rate rhythm, no murmur  Musculoskeletal: No deformity   Neurological examination   Mentation: Alert oriented to time, place, history taking. Follows all commands speech and language fluent  Cranial nerve II-XII: Visual acuity 20/30 bilaterally .Fundoscopic exam reveals sharp disc margins.Pupils were equal round reactive to light extraocular movements were full, visual field were full on confrontational test. Facial sensation and strength were normal. hearing was intact to finger rubbing bilaterally. Uvula tongue midline. head turning and shoulder shrug were normal and symmetric.Tongue protrusion into cheek strength was normal. Motor: normal bulk and tone, full strength in the BUE, BLE, No focal weakness Sensory: normal and symmetric to light touch  Coordination: finger-nose-finger, heel-to-shin bilaterally, no dysmetria Reflexes: Brachioradialis 2/2, biceps 2/2, triceps 2/2, patellar 2/2, Achilles 2/2, plantar responses  were flexor bilaterally. Gait and Station: Rising up from seated position without assistance, normal stance, moderate stride, good arm swing, smooth turning, able to perform tiptoe, and heel walking without difficulty. Tandem gait is steady DIAGNOSTIC DATA (LABS, IMAGING, TESTING) - I reviewed patient records, labs, notes, testing and imaging myself where available.  Lab Results  Component Value Date   WBC 5.9 06/27/2015   HGB 13.3 06/27/2015   HCT 40.6 06/27/2015   MCV 88.1 06/27/2015   PLT 224 06/27/2015      Component Value Date/Time   NA 140 06/26/2015 1538   K 3.6 06/26/2015 1538   CL 104 06/26/2015 1538   CO2 24 06/26/2015 1538   GLUCOSE 97 06/26/2015 1538   BUN 7 06/26/2015 1538   CREATININE 0.80 06/26/2015 1538   CALCIUM 9.3 06/26/2015 1538   PROT 7.0 06/26/2015 1538   ALBUMIN 3.9 06/26/2015 1538   AST 22 06/26/2015 1538   ALT 19 06/26/2015 1538   ALKPHOS 57 06/26/2015 1538   BILITOT  0.5 06/26/2015 1538   GFRNONAA >60 06/26/2015 1538   GFRAA >60 06/26/2015 1538    ASSESSMENT AND PLAN 60 y.o. year old female has a past medical history of High blood pressure; Thyroid disorder; and MS (multiple sclerosis). here to follow-up. She is currently on Avonex weekly IM without exacerbation of her MS symptoms. MRI of the brain 06/28/14 This is an abnormal MRI of the brain with and without contrast showing foci within the white matter in a pattern and configuration consistent with the clinical diagnosis of multiple sclerosis. None of the foci appears to be acute.  PLAN: Continue Avonex weekly gets assistance renewed Reviewed recent labs from the hospital 06/26/2015 within normal limits  Please send copy of labs when done in the next month by PCP Follow-up in 6 months Call for any exacerbation of symptoms Dennie Bible, [facility], [facility], APRN  [facility] [address] [place], [place] [address] [phone]

## 2016-01-02 NOTE — Patient Instructions (Addendum)
Continue Avonex weekly  Reviewed recent labs from the hospital 06/26/2015 within normal limits  Please send copy of labs when done by PCP Follow-up in 6 months Call for any exacerbation of symptoms

## 2016-02-28 ENCOUNTER — Telehealth: Payer: Self-pay | Admitting: Neurology

## 2016-02-28 ENCOUNTER — Other Ambulatory Visit: Payer: Self-pay | Admitting: *Deleted

## 2016-02-28 MED ORDER — AVONEX PEN 30 MCG/0.5ML IM AJKT: 30.0000 ug | AUTO-INJECTOR | INTRAMUSCULAR | 11 refills | Status: DC

## 2016-02-28 NOTE — Telephone Encounter (Signed)
Faxed RX to [facility] [phone].  Avonex 30 mcg PEN kit inject IM once peer week.

## 2016-07-02 ENCOUNTER — Other Ambulatory Visit: Payer: Self-pay | Admitting: Obstetrics & Gynecology

## 2016-07-02 DIAGNOSIS — Z01419 Encounter for gynecological examination (general) (routine) without abnormal findings: Secondary | ICD-10-CM | POA: Diagnosis not present

## 2016-07-02 DIAGNOSIS — Z1231 Encounter for screening mammogram for malignant neoplasm of breast: Secondary | ICD-10-CM | POA: Diagnosis not present

## 2016-07-02 DIAGNOSIS — Z124 Encounter for screening for malignant neoplasm of cervix: Secondary | ICD-10-CM | POA: Diagnosis not present

## 2016-07-03 LAB — CYTOLOGY - PAP

## 2016-07-04 ENCOUNTER — Encounter: Payer: Self-pay | Admitting: Nurse Practitioner

## 2016-07-04 ENCOUNTER — Ambulatory Visit (INDEPENDENT_AMBULATORY_CARE_PROVIDER_SITE_OTHER): Payer: Commercial Managed Care - PPO | Admitting: Nurse Practitioner

## 2016-07-04 VITALS — BP 115/69 | HR 73 | Ht 67.0 in | Wt 212.0 lb

## 2016-07-04 DIAGNOSIS — Z5181 Encounter for therapeutic drug level monitoring: Secondary | ICD-10-CM | POA: Diagnosis not present

## 2016-07-04 DIAGNOSIS — G35 Multiple sclerosis: Secondary | ICD-10-CM

## 2016-07-04 NOTE — Patient Instructions (Signed)
Continue Avonex weekly gets assistance does not need refills Obtain CBC and CMP to monitor adverse effects of Avonex  Will obtain MRI of the brain at next visit pt has not met deductable Follow-up in 6 months Call for any exacerbation of symptoms

## 2016-07-04 NOTE — Progress Notes (Signed)
 [facility]  PATIENT: Christina Carey DOB: 01/30/1956   REASON FOR VISIT: Follow-up for multiple sclerosis HISTORY FROM: Patient    HISTORY OF PRESENT ILLNESS:UPDATE 07/04/16 [name], 60-year-old female returns for follow-up with history of multiple sclerosis. Christina Carey is currently on Avonex and  denies injection site problems. Christina Carey denies double vision weakness speech or swallowing difficulties Christina Carey has not had any falls. Last MRI of the brain 06/28/2014 was abnormal with and without contrast showing foci within the white matter in a pattern and configuration consistent with the clinical diagnosis of multiple sclerosis no acute lesions. Christina Carey does complain of clumsiness all her life .Christina Carey complains of  balance issues, but as noted above no falls. Christina Carey returns today for reevaluation and Christina Carey will need labs to monitor liver function and side effects to Avonex. Christina Carey has tried both tecfidera and Betaseron in the past for her MS.   HISTORY: Christina Carey has a history of multiple sclerosis. Christina Carey was on Betaseron but was tired of shots at her last visit and was switched to tecfidera. When Christina Carey eats a full meal with the medication Christina Carey has much less flushing and abdominal symptoms however if Christina Carey eats a light meal her flushing is worse. Most recent MRI 05/29/2012 with previous MS lesions not seen. Christina Carey continues to have numbness which is intermittent on the top of the foot and Christina Carey also has a pressure area over the third toe on the right with significant corns on the bottom of both feet. Christina Carey denies any other sensory changes, focal weakness, speech or swallowing difficulty, double vision, blurred vision, balance issues   REVIEW OF SYSTEMS: Full 14 system review of systems performed and notable only for those listed, all others are neg:  Constitutional: neg  Cardiovascular: neg Ear/Nose/Throat: neg  Skin: neg Eyes: neg Respiratory: neg Gastroitestinal:  Hematology/Lymphatic: neg  Endocrine:  neg Musculoskeletal:neg Allergy/Immunology: neg Neurological: neg Psychiatric: neg Sleep : neg   ALLERGIES: Allergies  Allergen Reactions  . Sulfamethoxazole-Trimethoprim     REACTION: rash, taking trimethoprim now and is not having a problem. 01-05-15.    HOME MEDICATIONS: Outpatient Medications Prior to Visit  Medication Sig Dispense Refill  . amLODipine (NORVASC) 5 MG tablet Take 5 mg by mouth daily.  1  . AVONEX PEN 30 MCG/0.5ML AJKT Inject 30 mcg into the muscle once a week. 4 each 11  . Calcium Carbonate-Vitamin D (CALCIUM + D PO) Take 600 mg by mouth daily.    . Cinnamon 500 MG TABS Take 500 mg by mouth 2 (two) times daily.    . ESTRACE VAGINAL 0.1 MG/GM vaginal cream APPLY 1/2INCH IN VAGINAL OPENING EVERY NIGHT FOR 2WKS THEN APPLY TWICE WEEKLY UINTIL NEXT VISIT  3  . fexofenadine (ALLEGRA) 180 MG tablet Take 180 mg by mouth daily as needed.   0  . levothyroxine (SYNTHROID, LEVOTHROID) 75 MCG tablet 1 TABLET ONCE A DAY ORALLY 90 DAYS  1  . nystatin-triamcinolone (MYCOLOG II) cream Apply 1 application topically daily as needed (irration).   1  . simvastatin (ZOCOR) 5 MG tablet Take 5 mg by mouth every evening.     . trandolapril (MAVIK) 4 MG tablet Take 4 mg by mouth daily.  1  . trimethoprim (TRIMPEX) 100 MG tablet Take 100 mg by mouth daily.    . fluticasone (FLONASE) 50 MCG/ACT nasal spray INHALE 1-2 SPRAYS IN EACH NOSTRIL 2 TIMES DAILY prn  0   No facility-administered medications prior to visit.     PAST   MEDICAL HISTORY: Past Medical History:  Diagnosis Date  . Colitis 06/2015  . High blood pressure   . MS (multiple sclerosis) (HCC)   . Thyroid disorder     PAST SURGICAL HISTORY: Past Surgical History:  Procedure Laterality Date  . COLONOSCOPY    . WISDOM TOOTH EXTRACTION      FAMILY HISTORY: Family History  Problem Relation Age of Onset  . Cancer Mother   . Cancer Father   . Cancer Maternal Grandmother   . Colon cancer Neg Hx     SOCIAL  HISTORY: Social History   Social History  . Marital status: Married    Spouse name: Lawrence  . Number of children: 2  . Years of education: 16   Occupational History  . ACCOUNTING [employer]   Social History Main Topics  . Smoking status: Never Smoker  . Smokeless tobacco: Never Used  . Alcohol use No     Comment: caffeine drinks 1-2 daily  . Drug use: No  . Sexual activity: Not on file   Other Topics Concern  . Not on file   Social History Narrative   Patient lives at home with her husband (Lawrence aka JR. and they have 2 adult children. 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    Patient is right- handed.   Patient drinks one cup of coffee daily and soda very rarely.     PHYSICAL EXAM  Vitals:   07/04/16 0810  BP: 115/69  Pulse: 73  Weight: 212 lb (96.2 kg)  Height: 5' 7" (1.702 m)   Body mass index is 33.2 kg/m. Generalized: Well developed, obese female in no acute distress  Head: normocephalic and atraumatic,. Oropharynx benign  Neck: Supple, no carotid bruits  Cardiac: Regular rate rhythm, no murmur  Musculoskeletal: No deformity  Skin, no rash or petechiae  Neurological examination   Mentation: Alert oriented to time, place, history taking. Follows all commands speech and language fluent  Cranial nerve II-XII: Visual acuity 20/30 bilaterally .Fundoscopic exam reveals sharp disc margins.Pupils were equal round reactive to light extraocular movements were full, visual field were full on confrontational test. Facial sensation and strength were normal. hearing was intact to finger rubbing bilaterally. Uvula tongue midline. head turning and shoulder shrug were normal and symmetric.Tongue protrusion into cheek strength was normal. Motor: normal bulk and tone, full strength in the BUE, BLE, Sensory: normal and symmetric to light touch , vibratory and pinprick in the upper and lower extremities Coordination: finger-nose-finger,  heel-to-shin bilaterally, no dysmetria, no tremor Reflexes: Brachioradialis 2/2, biceps 2/2, triceps 2/2, patellar 2/2, Achilles 2/2, plantar responses were flexor bilaterally. Gait and Station: Rising up from seated position without assistance, normal stance, moderate stride, good arm swing, smooth turning, able to perform tiptoe, and heel walking without difficulty. Tandem gait is steady DIAGNOSTIC DATA (LABS, IMAGING, TESTING) - I reviewed patient records, labs, notes, testing and imaging myself where available.  Lab Results  Component Value Date   WBC 5.9 06/27/2015   HGB 13.3 06/27/2015   HCT 40.6 06/27/2015   MCV 88.1 06/27/2015   PLT 224 06/27/2015      Component Value Date/Time   NA 140 06/26/2015 1538   K 3.6 06/26/2015 1538   CL 104 06/26/2015 1538   CO2 24 06/26/2015 1538   GLUCOSE 97 06/26/2015 1538   BUN 7 06/26/2015 1538   CREATININE 0.80 06/26/2015 1538   CALCIUM 9.3 06/26/2015 1538   PROT 7.0 06/26/2015 1538     ALBUMIN 3.9 06/26/2015 1538   AST 22 06/26/2015 1538   ALT 19 06/26/2015 1538   ALKPHOS 57 06/26/2015 1538   BILITOT 0.5 06/26/2015 1538   GFRNONAA >60 06/26/2015 1538   GFRAA >60 06/26/2015 1538    ASSESSMENT AND PLAN 61 y.o. year old female has a past medical history of  MS (multiple sclerosis). here to follow-up. Christina Carey is currently on Avonex weekly IM without exacerbation of her MS symptoms. MRI of the brain 06/28/14 This is an abnormal MRI of the brain with and without contrast showing foci within the white matter in a pattern and configuration consistent with the clinical diagnosis of multiple sclerosis. None of the foci are acute.  PLAN: Continue Avonex weekly gets assistance does not need refills Obtain CBC and CMP to monitor adverse effects of Avonex  Will obtain MRI of the brain at next visit pt has not met deductable Follow-up in 6 months Call for any exacerbation of symptoms I spent 15 min in total face to face time with the patient more than  50% of which was spent counseling and coordination of care, reviewing test results reviewing medications and discussing and reviewing the diagnosis of MS .  Dennie Bible, [facility], [facility], APRN  [facility] [address] [place], [place] [address] [phone]

## 2016-07-04 NOTE — Progress Notes (Signed)
I have read the note, and I agree with the clinical assessment and plan.  Clotile Whittington KEITH   

## 2016-07-05 ENCOUNTER — Telehealth: Payer: Self-pay | Admitting: *Deleted

## 2016-07-05 LAB — COMPREHENSIVE METABOLIC PANEL
A/G RATIO: 1.4 (ref 1.2–2.2)
ALBUMIN: 4.2 g/dL (ref 3.5–5.5)
ALK PHOS: 56 IU/L (ref 39–117)
ALT: 15 IU/L (ref 0–32)
AST: 17 IU/L (ref 0–40)
BILIRUBIN TOTAL: 0.3 mg/dL (ref 0.0–1.2)
BUN / CREAT RATIO: 10 (ref 9–23)
BUN: 9 mg/dL (ref 6–24)
CO2: 28 mmol/L (ref 18–29)
Calcium: 9.7 mg/dL (ref 8.7–10.2)
Chloride: 101 mmol/L (ref 96–106)
Creatinine, Ser: 0.89 mg/dL (ref 0.57–1.00)
GFR calc Af Amer: 82 mL/min/{1.73_m2} (ref >59)
GFR calc non Af Amer: 71 mL/min/{1.73_m2} (ref >59)
GLOBULIN, TOTAL: 2.9 g/dL (ref 1.5–4.5)
Glucose: 90 mg/dL (ref 65–99)
POTASSIUM: 4.8 mmol/L (ref 3.5–5.2)
SODIUM: 142 mmol/L (ref 134–144)
Total Protein: 7.1 g/dL (ref 6.0–8.5)

## 2016-07-05 LAB — CBC WITH DIFFERENTIAL/PLATELET
Basophils Absolute: 0.1 10*3/uL (ref 0.0–0.2)
Basos: 1 %
EOS (ABSOLUTE): 0.2 10*3/uL (ref 0.0–0.4)
EOS: 4 %
HEMATOCRIT: 41.1 % (ref 34.0–46.6)
Hemoglobin: 13.8 g/dL (ref 11.1–15.9)
IMMATURE GRANULOCYTES: 0 %
Immature Grans (Abs): 0 10*3/uL (ref 0.0–0.1)
LYMPHS ABS: 1.4 10*3/uL (ref 0.7–3.1)
Lymphs: 26 %
MCH: 29.2 pg (ref 26.6–33.0)
MCHC: 33.6 g/dL (ref 31.5–35.7)
MCV: 87 fL (ref 79–97)
MONOS ABS: 0.6 10*3/uL (ref 0.1–0.9)
Monocytes: 11 %
NEUTROS PCT: 58 %
Neutrophils Absolute: 3 10*3/uL (ref 1.4–7.0)
PLATELETS: 289 10*3/uL (ref 150–379)
RBC: 4.72 x10E6/uL (ref 3.77–5.28)
RDW: 14.8 % (ref 12.3–15.4)
WBC: 5.3 10*3/uL (ref 3.4–10.8)

## 2016-07-05 NOTE — Telephone Encounter (Signed)
Per Daun Peacock, NP, spoke with patient and informed her that her labs look good, and a copy will be faxed to her PCP. She verbalized understanding, appreciation.  Labs faxed to [facility].

## 2016-07-23 ENCOUNTER — Telehealth: Payer: Self-pay | Admitting: Nurse Practitioner

## 2016-07-23 NOTE — Telephone Encounter (Signed)
[facility] [phone] opt 2 REF# [id number] called AVONEX PEN 30 MCG/0.5ML AJKT needs PA

## 2016-07-24 NOTE — Telephone Encounter (Signed)
Initiated thru cover my meds PA. HATNXD.

## 2016-07-26 NOTE — Telephone Encounter (Signed)
Spoke to [employer] with [employer].  Relayed that PA initiated but not needed as avonex on list of covered meds.  She would relay to [place].

## 2016-07-26 NOTE — Telephone Encounter (Signed)
Re-initiated thru cover my meds NBCKCP,  as previous below key was expired.

## 2016-07-26 NOTE — Telephone Encounter (Signed)
Received that avonex pen is on list of covered meds.  PA not required at this time.  [employer]um Rx [id number].   PO-[id number],

## 2016-07-30 ENCOUNTER — Encounter: Payer: Self-pay | Admitting: Nurse Practitioner

## 2016-08-13 DIAGNOSIS — E119 Type 2 diabetes mellitus without complications: Secondary | ICD-10-CM | POA: Diagnosis not present

## 2016-08-13 DIAGNOSIS — H18892 Other specified disorders of cornea, left eye: Secondary | ICD-10-CM | POA: Diagnosis not present

## 2016-08-13 DIAGNOSIS — H40013 Open angle with borderline findings, low risk, bilateral: Secondary | ICD-10-CM | POA: Diagnosis not present

## 2016-08-14 ENCOUNTER — Telehealth: Payer: Self-pay | Admitting: Neurology

## 2016-08-14 NOTE — Telephone Encounter (Signed)
The patient was seen on 08/13/2016 by Dr. Kathlen Mody from ophthalmology. The patient is on Avonex, has history of multiple sclerosis. The patient does have cataracts bilaterally that are mild, no retinopathy seen.

## 2016-12-13 IMAGING — CT CT ABD-PELV W/ CM
2 of 5 series · 16 of 46 positions shown, 18 images · IV contrast (omnipaque)
Comparison: Lumbar spine 01/18/2010

CLINICAL DATA: Diarrhea 2 days with bright-red blood per rectum and
lower abdominal cramping.

EXAM:
CT ABDOMEN AND PELVIS WITH CONTRAST
TECHNIQUE: Multidetector CT imaging of the abdomen and pelvis was performed
using the standard protocol following bolus administration of
intravenous contrast.
CONTRAST:  100mL OMNIPAQUE IOHEXOL 300 MG/ML  SOLN

[Series 2: a/p w/ 5mm · axial · 0.76mm/px · z∈[-466,+14]mm · 13 of 108 slices shown, 15 images]
[im 6/108  soft-tissue]
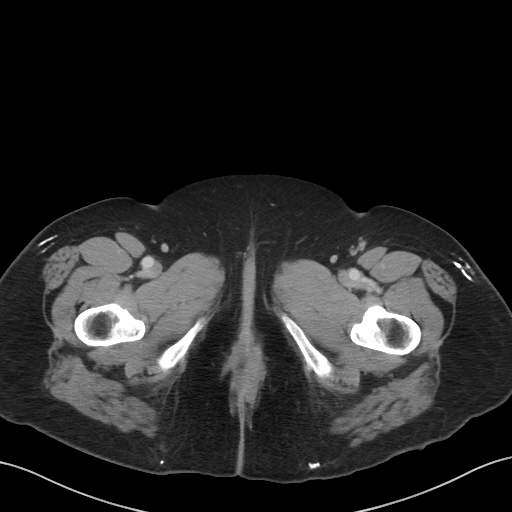
[im 6/108  bone]
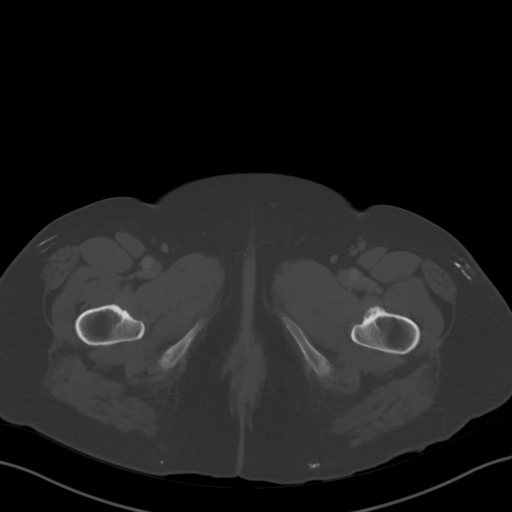
[im 17/108  soft-tissue]
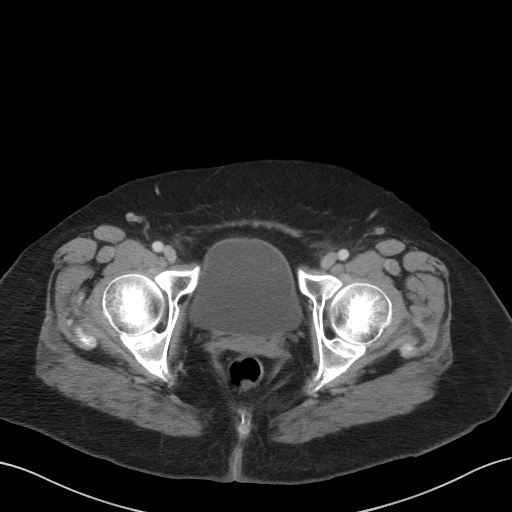
[im 23/108  soft-tissue]
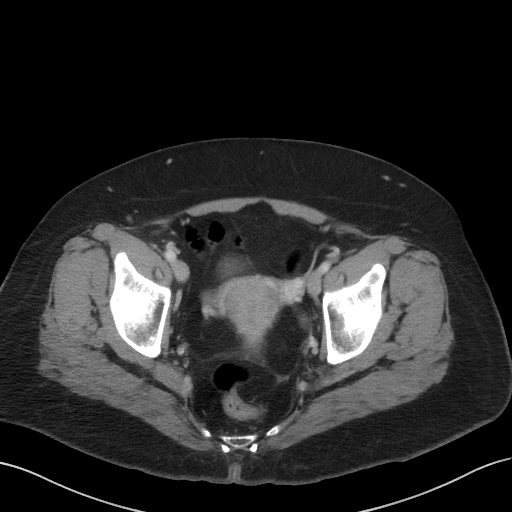
[im 29/108  soft-tissue]
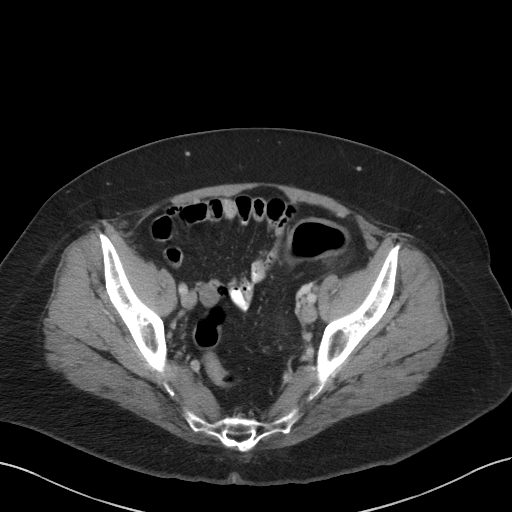
[im 40/108  soft-tissue]
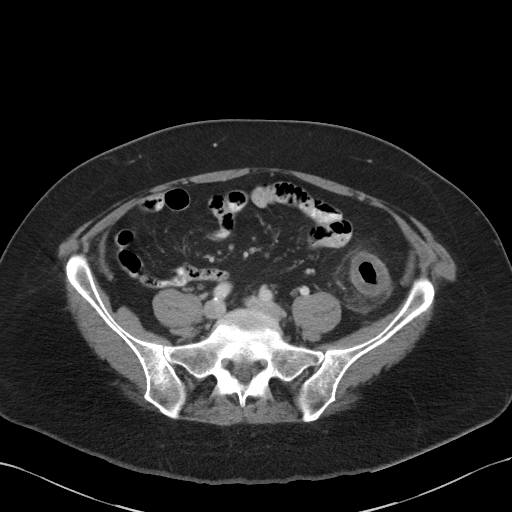
[im 46/108  soft-tissue]
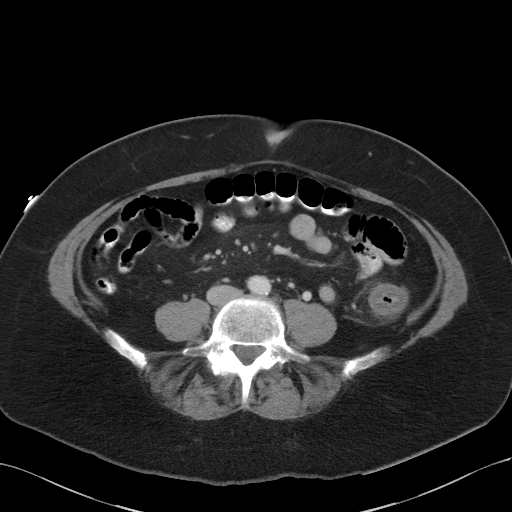
[im 57/108  soft-tissue]
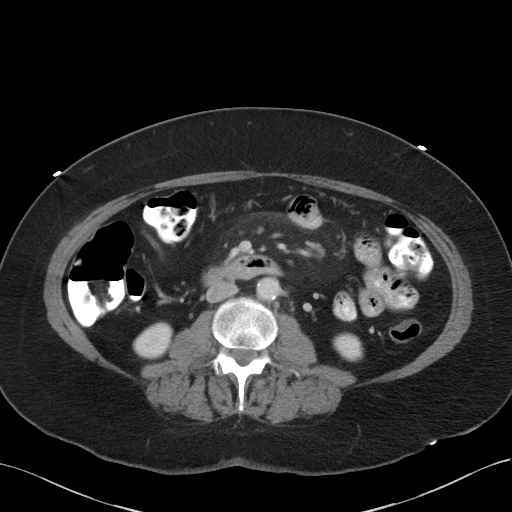
[im 62/108  soft-tissue]
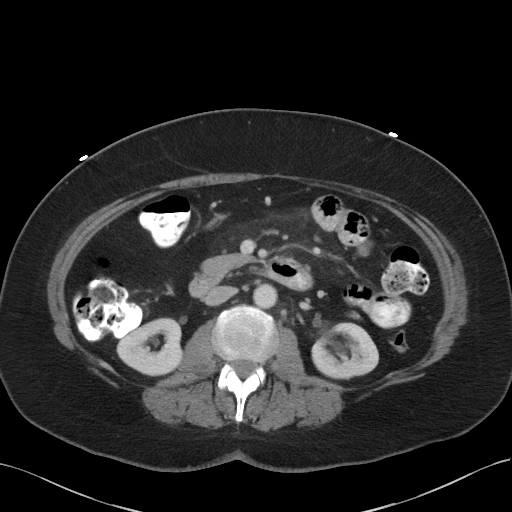
[im 68/108  soft-tissue]
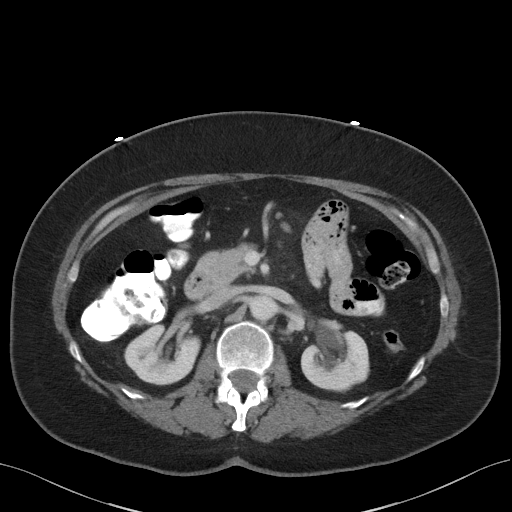
[im 68/108  bone]
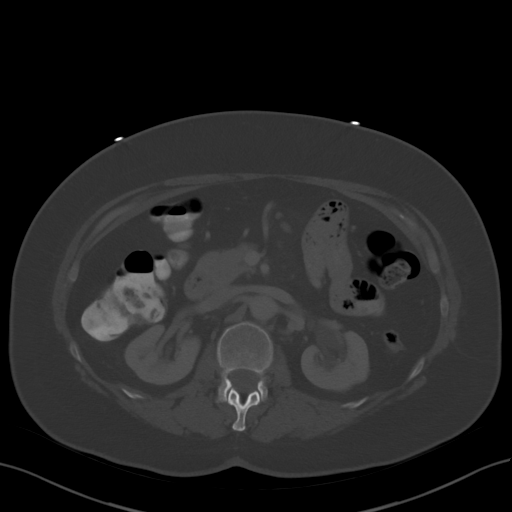
[im 79/108  soft-tissue]
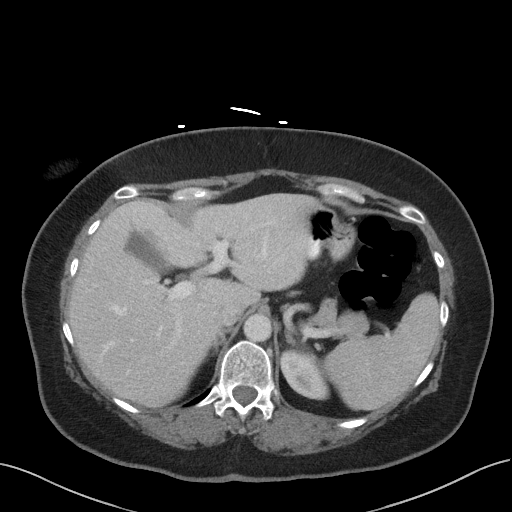
[im 85/108  soft-tissue]
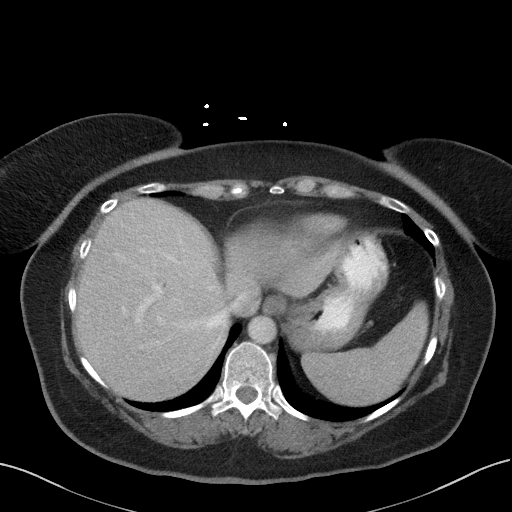
[im 91/108  soft-tissue]
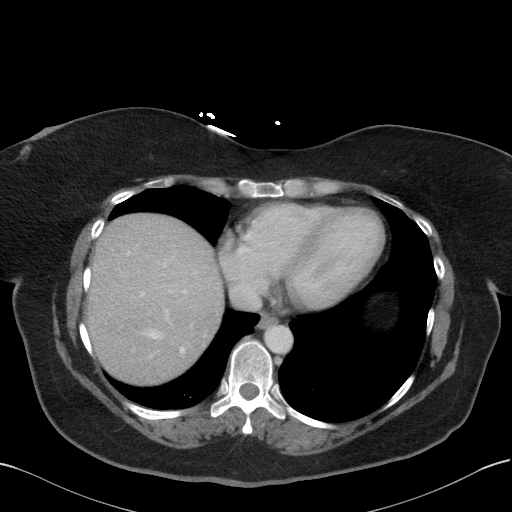
[im 102/108  soft-tissue]
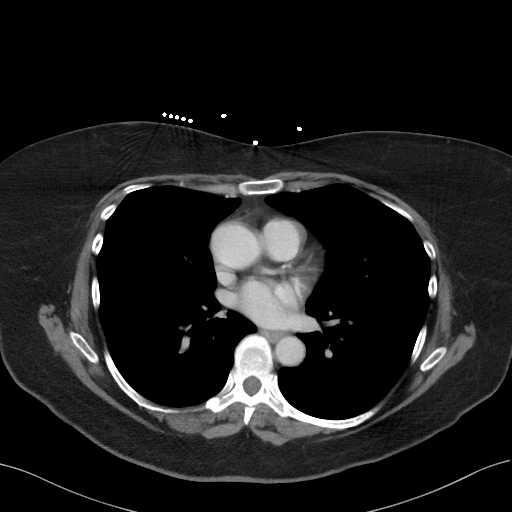

[Series 5: a/p w/ cor · coronal · 0.87mm/px · 3 of 124 slices shown]
[im 42/124  soft-tissue]
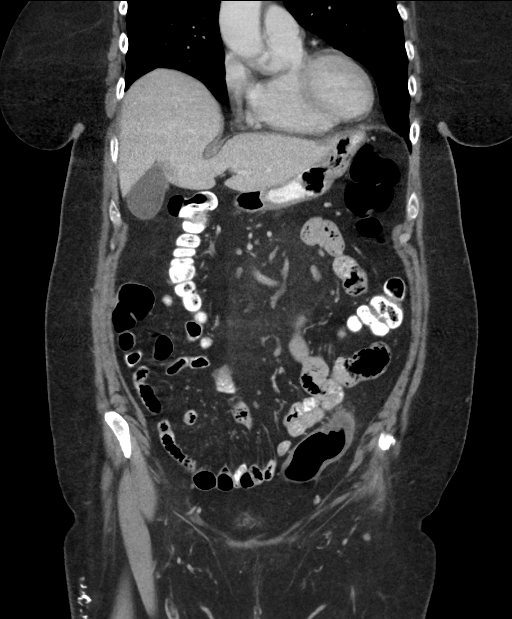
[im 55/124  soft-tissue]
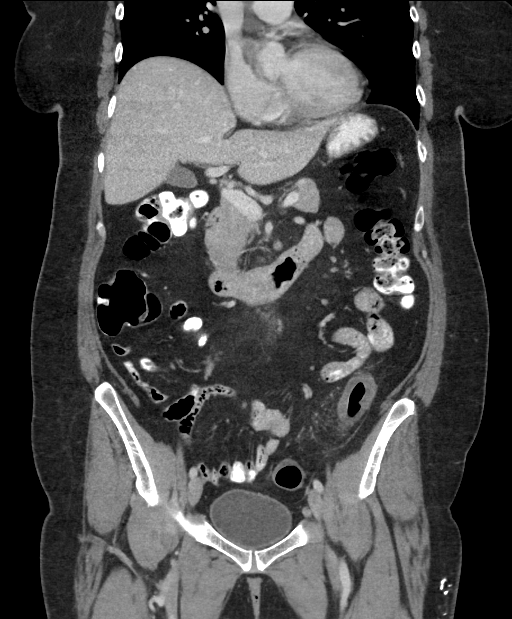
[im 69/124  soft-tissue]
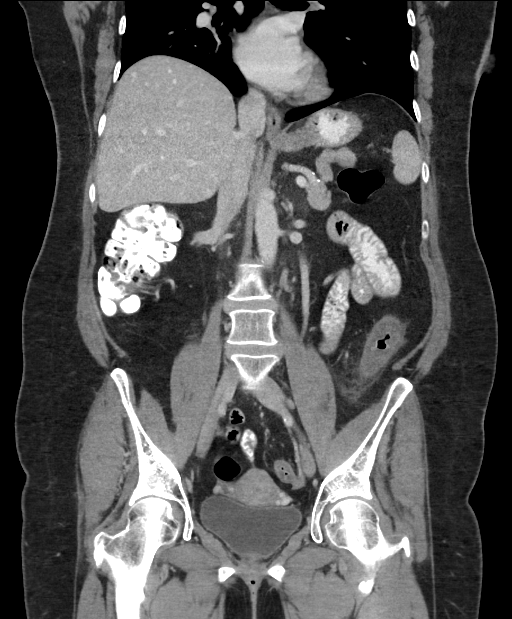

[16 of 46 positions shown; findings below may reference images not displayed]

FINDINGS: Lung bases are within normal.

Abdominal images demonstrate the liver, spleen, gallbladder,
pancreas and adrenal glands to be within normal. The stomach is
normal.

Kidneys are normal in size without hydronephrosis or
nephrolithiasis. There are a couple small parapelvic left renal
cysts. Ureters are within normal.

Appendix is normal. There is moderate circumferential wall
thickening of the mid to distal aspect of the descending colon with
mild adjacent inflammatory change in the pericolonic fat. Findings
compatible with acute colitis. No evidence of perforation or
abscess. Secondary inflammation of an epiploic appendage anterior to
the distal aspect of the descending colon in the left lower
quadrant. Small bowel is within normal.

Subtle hazy attenuation of the mid mesentery which may be due to
mesenteric panniculitis. Few small associated mesenteric lymph
nodes.

Mild calcified plaque over the abdominal aorta and iliac arteries.

Pelvic images demonstrate the uterus, ovaries, bladder and rectum to
be within normal.

There mild degenerate changes of the spine and hips. Mild stable
compression deformity of L1.
IMPRESSION: Evidence of acute colitis involving the mid to distal aspect of the
descending colon. No perforation or abscess. Secondary inflammation
of an epiploic appendage adjacent the distal aspect of the
descending colon.

Several parapelvic left renal cysts.

Hazy attenuation of the mid mesentery with a few small associated
mesenteric lymph nodes suggesting mild mesenteric panniculitis.

Stable mild L1 compression deformity.

## 2017-01-04 ENCOUNTER — Ambulatory Visit: Payer: Commercial Managed Care - PPO | Admitting: Nurse Practitioner

## 2017-01-09 NOTE — Progress Notes (Signed)
[facility]  PATIENT: Christina Carey DOB: 12/04/1955   REASON FOR VISIT: Follow-up for multiple sclerosis HISTORY FROM: Patient    HISTORY OF PRESENT ILLNESS:UPDATE 01/10/17 CM Ms. Shroff, 61 year old female returns for follow-up with history of multiple sclerosis. She is currently on Avonex and  denies injection site problems. She denies double vision weakness speech or swallowing difficulties she has not had any falls. Last MRI of the brain 06/28/2014 was abnormal with and without contrast showing foci within the white matter in a pattern and configuration consistent with the clinical diagnosis of multiple sclerosis no acute lesions. She does complain of clumsiness all her life .She complains of  balance issues, but as noted above no falls. She returns today for reevaluation and she will need labs to monitor liver function and side effects to Avonex. She has tried both tecfidera and Betaseron in the past for her MS. She needs repeat MRI of the brain to follow-up progression of her disease. She returns for reevaluation   HISTORY: She has a history of multiple sclerosis. She was on Betaseron but was tired of shots at her last visit and was switched to tecfidera. When she eats a full meal with the medication she has much less flushing and abdominal symptoms however if she eats a light meal her flushing is worse. Most recent MRI 05/29/2012 with previous MS lesions not seen. She continues to have numbness which is intermittent on the top of the foot and she also has a pressure area over the third toe on the right with significant corns on the bottom of both feet. She denies any other sensory changes, focal weakness, speech or swallowing difficulty, double vision, blurred vision, balance issues   REVIEW OF SYSTEMS: Full 14 system review of systems performed and notable only for those listed, all others are neg:  Constitutional: neg  Cardiovascular: neg Ear/Nose/Throat: neg    Skin: neg Eyes: neg Respiratory: neg Gastroitestinal:  Hematology/Lymphatic: neg  Endocrine: neg Musculoskeletal:neg Allergy/Immunology: neg Neurological: neg Psychiatric: neg Sleep : neg   ALLERGIES: Allergies  Allergen Reactions  . Sulfamethoxazole-Trimethoprim     REACTION: rash, taking trimethoprim now and is not having a problem. 01-05-15.    HOME MEDICATIONS: Outpatient Medications Prior to Visit  Medication Sig Dispense Refill  . amLODipine (NORVASC) 5 MG tablet Take 5 mg by mouth daily.  1  . AVONEX PEN 30 MCG/0.5ML AJKT Inject 30 mcg into the muscle once a week. 4 each 11  . Calcium Carbonate-Vitamin D (CALCIUM + D PO) Take 600 mg by mouth daily.    . Cinnamon 500 MG TABS Take 500 mg by mouth 2 (two) times daily.    Marland Kitchen ESTRACE VAGINAL 0.1 MG/GM vaginal cream APPLY 1/2INCH IN VAGINAL OPENING EVERY NIGHT FOR 2WKS THEN APPLY TWICE WEEKLY UINTIL NEXT VISIT  3  . fexofenadine (ALLEGRA) 180 MG tablet Take 180 mg by mouth daily as needed.   0  . levothyroxine (SYNTHROID, LEVOTHROID) 75 MCG tablet 1 TABLET ONCE A DAY ORALLY 90 DAYS  1  . nystatin-triamcinolone (MYCOLOG II) cream Apply 1 application topically daily as needed (irration).   1  . simvastatin (ZOCOR) 5 MG tablet Take 5 mg by mouth every evening.     . trandolapril (MAVIK) 4 MG tablet Take 4 mg by mouth daily.  1  . trimethoprim (TRIMPEX) 100 MG tablet Take 100 mg by mouth daily.     No facility-administered medications prior to visit.     PAST MEDICAL HISTORY:  Past Medical History:  Diagnosis Date  . Colitis 06/2015  . High blood pressure   . MS (multiple sclerosis) ([place])   . Thyroid disorder     PAST SURGICAL HISTORY: Past Surgical History:  Procedure Laterality Date  . COLONOSCOPY    . WISDOM TOOTH EXTRACTION      FAMILY HISTORY: Family History  Problem Relation Age of Onset  . Cancer Mother   . Cancer Father   . Cancer Maternal Grandmother   . Colon cancer Neg Hx     SOCIAL  HISTORY: Social History   Social History  . Marital status: Married    Spouse name: Purcell Nails  . Number of children: 2  . Years of education: 16   Occupational History  . [employer]   Social History Main Topics  . Smoking status: Never Smoker  . Smokeless tobacco: Never Used  . Alcohol use No     Comment: caffeine drinks 1-2 daily  . Drug use: No  . Sexual activity: Not on file   Other Topics Concern  . Not on file   Social History Narrative   Patient lives at home with her husband Farrel Gordon. and they have 2 adult children. 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    Patient is right- handed.   Patient drinks one cup of coffee daily and soda very rarely.     PHYSICAL EXAM  Vitals:   01/10/17 0932  BP: (!) 142/81  Pulse: 79  Weight: 214 lb 9.6 oz (97.3 kg)   Body mass index is 33.61 kg/m. Generalized: Well developed, obese female in no acute distress  Head: normocephalic and atraumatic,. Oropharynx benign  Neck: Supple, no carotid bruits  Cardiac: Regular rate rhythm, no murmur  Musculoskeletal: No deformity  Skin, no rash or petechiae  Neurological examination   Mentation: Alert oriented to time, place, history taking. Follows all commands speech and language fluent  Cranial nerve II-XII: Visual acuity 20/70 bilaterally .Fundoscopic exam reveals sharp disc margins.Pupils were equal round reactive to light extraocular movements were full, visual field were full on confrontational test. Facial sensation and strength were normal. hearing was intact to finger rubbing bilaterally. Uvula tongue midline. head turning and shoulder shrug were normal and symmetric.Tongue protrusion into cheek strength was normal. Motor: normal bulk and tone, full strength in the BUE, BLE, Sensory: normal and symmetric to light touch , vibratory and pinprick in the upper and lower extremities Coordination: finger-nose-finger, heel-to-shin  bilaterally, no dysmetria, no tremor Reflexes: Brachioradialis 2/2, biceps 2/2, triceps 2/2, patellar 2/2, Achilles 2/2, plantar responses were flexor bilaterally. Gait and Station: Rising up from seated position without assistance, normal stance, moderate stride, good arm swing, smooth turning, able to perform tiptoe, and heel walking without difficulty. Tandem gait is steady DIAGNOSTIC DATA (LABS, IMAGING, TESTING) - I reviewed patient records, labs, notes, testing and imaging myself where available.  Lab Results  Component Value Date   WBC 5.3 07/04/2016   HGB 13.8 07/04/2016   HCT 41.1 07/04/2016   MCV 87 07/04/2016   PLT 289 07/04/2016      Component Value Date/Time   NA 142 07/04/2016 0906   K 4.8 07/04/2016 0906   CL 101 07/04/2016 0906   CO2 28 07/04/2016 0906   GLUCOSE 90 07/04/2016 0906   GLUCOSE 97 06/26/2015 1538   BUN 9 07/04/2016 0906   CREATININE 0.89 07/04/2016 0906   CALCIUM 9.7 07/04/2016 0906   PROT 7.1 07/04/2016 0906  ALBUMIN 4.2 07/04/2016 0906   AST 17 07/04/2016 0906   ALT 15 07/04/2016 0906   ALKPHOS 56 07/04/2016 0906   BILITOT 0.3 07/04/2016 0906   GFRNONAA 71 07/04/2016 0906   GFRAA 82 07/04/2016 0906    ASSESSMENT AND PLAN 61 y.o. year old female has a past medical history of  MS (multiple sclerosis). here to follow-up. She is currently on Avonex weekly IM without exacerbation of her MS symptoms. MRI of the brain 06/28/14  abnormal MRI of the brain with and without contrast showing foci within the white matter in a pattern and configuration consistent with the clinical diagnosis of multiple sclerosis. None of the foci are acute.  PLAN: Continue Avonex weekly gets pt. assistance  Obtain CBC and CMP to monitor adverse effects of Avonex  Will obtain MRI of the brain follow up MS progression Follow-up in 6 months Call for any exacerbation of symptoms  Dennie Bible, [facility], [facility], APRN  [facility] [address] [place], [place] [address] [phone]

## 2017-01-10 ENCOUNTER — Ambulatory Visit (INDEPENDENT_AMBULATORY_CARE_PROVIDER_SITE_OTHER): Payer: Commercial Managed Care - PPO | Admitting: Nurse Practitioner

## 2017-01-10 ENCOUNTER — Encounter: Payer: Self-pay | Admitting: Nurse Practitioner

## 2017-01-10 VITALS — BP 142/81 | HR 79 | Wt 214.6 lb

## 2017-01-10 DIAGNOSIS — Z5181 Encounter for therapeutic drug level monitoring: Secondary | ICD-10-CM

## 2017-01-10 DIAGNOSIS — G35 Multiple sclerosis: Secondary | ICD-10-CM

## 2017-01-10 NOTE — Progress Notes (Signed)
I have read the note, and I agree with the clinical assessment and plan.  Obdulio Mash KEITH   

## 2017-01-10 NOTE — Patient Instructions (Signed)
Continue Avonex weekly gets assistance  Obtain CBC and CMP to monitor adverse effects of Avonex  Will obtain MRI of the brain follow up MS progression Follow-up in 6 months Call for any exacerbation of symptoms

## 2017-01-11 LAB — CBC WITH DIFFERENTIAL/PLATELET
BASOS ABS: 0 10*3/uL (ref 0.0–0.2)
Basos: 1 %
EOS (ABSOLUTE): 0.1 10*3/uL (ref 0.0–0.4)
Eos: 3 %
Hematocrit: 40.2 % (ref 34.0–46.6)
Hemoglobin: 13.1 g/dL (ref 11.1–15.9)
IMMATURE GRANS (ABS): 0 10*3/uL (ref 0.0–0.1)
IMMATURE GRANULOCYTES: 0 %
LYMPHS: 21 %
Lymphocytes Absolute: 1.1 10*3/uL (ref 0.7–3.1)
MCH: 28.1 pg (ref 26.6–33.0)
MCHC: 32.6 g/dL (ref 31.5–35.7)
MCV: 86 fL (ref 79–97)
MONOS ABS: 0.4 10*3/uL (ref 0.1–0.9)
Monocytes: 8 %
NEUTROS PCT: 67 %
Neutrophils Absolute: 3.5 10*3/uL (ref 1.4–7.0)
PLATELETS: 256 10*3/uL (ref 150–379)
RBC: 4.67 x10E6/uL (ref 3.77–5.28)
RDW: 15 % (ref 12.3–15.4)
WBC: 5.3 10*3/uL (ref 3.4–10.8)

## 2017-01-11 LAB — COMPREHENSIVE METABOLIC PANEL
A/G RATIO: 1.6 (ref 1.2–2.2)
ALK PHOS: 56 IU/L (ref 39–117)
ALT: 16 IU/L (ref 0–32)
AST: 23 IU/L (ref 0–40)
Albumin: 4.4 g/dL (ref 3.6–4.8)
BILIRUBIN TOTAL: 0.3 mg/dL (ref 0.0–1.2)
BUN/Creatinine Ratio: 15 (ref 12–28)
BUN: 13 mg/dL (ref 8–27)
CALCIUM: 9.4 mg/dL (ref 8.7–10.3)
CHLORIDE: 100 mmol/L (ref 96–106)
CO2: 24 mmol/L (ref 20–29)
Creatinine, Ser: 0.86 mg/dL (ref 0.57–1.00)
GFR calc Af Amer: 85 mL/min/{1.73_m2} (ref >59)
GFR, EST NON AFRICAN AMERICAN: 74 mL/min/{1.73_m2} (ref >59)
Globulin, Total: 2.7 g/dL (ref 1.5–4.5)
Glucose: 84 mg/dL (ref 65–99)
POTASSIUM: 4.3 mmol/L (ref 3.5–5.2)
Sodium: 139 mmol/L (ref 134–144)
Total Protein: 7.1 g/dL (ref 6.0–8.5)

## 2017-01-14 ENCOUNTER — Telehealth: Payer: Self-pay | Admitting: *Deleted

## 2017-01-14 NOTE — Telephone Encounter (Signed)
Spoke with patient and informed her that her lab results are stable. She verbalized understanding, appreciation.

## 2017-01-21 ENCOUNTER — Ambulatory Visit
Admission: RE | Admit: 2017-01-21 | Discharge: 2017-01-21 | Disposition: A | Discharge status: Home / Self Care | Payer: Commercial Managed Care - PPO | Source: Ambulatory Visit | Attending: Nurse Practitioner | Admitting: Nurse Practitioner

## 2017-01-21 ENCOUNTER — Other Ambulatory Visit: Payer: Self-pay | Admitting: *Deleted

## 2017-01-21 DIAGNOSIS — Z5181 Encounter for therapeutic drug level monitoring: Secondary | ICD-10-CM

## 2017-01-21 DIAGNOSIS — G35 Multiple sclerosis: Secondary | ICD-10-CM | POA: Diagnosis not present

## 2017-01-21 MED ORDER — GADOBENATE DIMEGLUMINE 529 MG/ML IV SOLN
20.0000 mL | Freq: Once | INTRAVENOUS | Status: AC | PRN
  Administered 2017-01-21: 20 mL via INTRAVENOUS

## 2017-01-21 NOTE — Telephone Encounter (Signed)
Avonex refill Rx faxed to [facility].  This is new pharmacy servicing the [facility] [employer]. Patient has been accepted into the program.

## 2017-01-24 ENCOUNTER — Telehealth: Payer: Self-pay | Admitting: *Deleted

## 2017-01-24 NOTE — Telephone Encounter (Signed)
Spoke with patient and informed her that her MRI of the brain is without change from her MRI February 2016. She verbalized understanding, appreciation and had no questions.

## 2017-02-12 DIAGNOSIS — H04123 Dry eye syndrome of bilateral lacrimal glands: Secondary | ICD-10-CM | POA: Diagnosis not present

## 2017-02-12 DIAGNOSIS — H40013 Open angle with borderline findings, low risk, bilateral: Secondary | ICD-10-CM | POA: Diagnosis not present

## 2017-02-25 DIAGNOSIS — Z0000 Encounter for general adult medical examination without abnormal findings: Secondary | ICD-10-CM | POA: Diagnosis not present

## 2017-03-04 DIAGNOSIS — M8580 Other specified disorders of bone density and structure, unspecified site: Secondary | ICD-10-CM | POA: Diagnosis not present

## 2017-03-04 DIAGNOSIS — E559 Vitamin D deficiency, unspecified: Secondary | ICD-10-CM | POA: Diagnosis not present

## 2017-03-04 DIAGNOSIS — Z0000 Encounter for general adult medical examination without abnormal findings: Secondary | ICD-10-CM | POA: Diagnosis not present

## 2017-03-04 DIAGNOSIS — R1084 Generalized abdominal pain: Secondary | ICD-10-CM | POA: Diagnosis not present

## 2017-03-04 DIAGNOSIS — E785 Hyperlipidemia, unspecified: Secondary | ICD-10-CM | POA: Diagnosis not present

## 2017-03-04 DIAGNOSIS — E1122 Type 2 diabetes mellitus with diabetic chronic kidney disease: Secondary | ICD-10-CM | POA: Diagnosis not present

## 2017-03-04 DIAGNOSIS — M859 Disorder of bone density and structure, unspecified: Secondary | ICD-10-CM | POA: Diagnosis not present

## 2017-03-05 ENCOUNTER — Telehealth: Payer: Self-pay | Admitting: Nurse Practitioner

## 2017-03-05 NOTE — Telephone Encounter (Signed)
Received from [facility] dated 02/25/2017 TSH 4.980 UA negative Vitamin D level 33.4

## 2017-05-02 ENCOUNTER — Ambulatory Visit: Payer: Commercial Managed Care - PPO | Admitting: Gastroenterology

## 2017-05-02 ENCOUNTER — Encounter: Payer: Self-pay | Admitting: Gastroenterology

## 2017-05-02 ENCOUNTER — Other Ambulatory Visit (INDEPENDENT_AMBULATORY_CARE_PROVIDER_SITE_OTHER): Payer: Commercial Managed Care - PPO

## 2017-05-02 VITALS — BP 120/86 | HR 80 | Ht 67.0 in | Wt 216.6 lb

## 2017-05-02 DIAGNOSIS — K589 Irritable bowel syndrome without diarrhea: Secondary | ICD-10-CM

## 2017-05-02 DIAGNOSIS — R159 Full incontinence of feces: Secondary | ICD-10-CM

## 2017-05-02 LAB — IGA: IgA: 108 mg/dL (ref 68–378)

## 2017-05-02 MED ORDER — DICYCLOMINE HCL 10 MG PO CAPS: 10.0000 mg | ORAL_CAPSULE | Freq: Three times a day (TID) | ORAL | 3 refills | Status: DC | PRN

## 2017-05-02 NOTE — Patient Instructions (Addendum)
If you are age 60 or older, your body mass index should be between 23-30. Your Body mass index is 33.92 kg/m. If this is out of the aforementioned range listed, please consider follow up with your Primary Care Provider.  If you are age 77 or younger, your body mass index should be between 19-25. Your Body mass index is 33.92 kg/m. If this is out of the aformentioned range listed, please consider follow up with your Primary Care Provider.   Please go to the lab in the basement of our building to have lab work done as you leave today.  We have sent the following medications to your pharmacy for you to pick up at your convenience: Bentyl, 10mg    Please purchase Citrucel over the counter and take daily.  We will refer you for Pelvic Floor physical therapy.  You will hear from them to schedule an appointment.  If you haven't heard within a week or two let us know.   We have given you a Low FODMAP diet to follow.   Thank you for entrusting me with your care, Dr. [place] Cellar

## 2017-05-02 NOTE — Progress Notes (Signed)
HPI :  61 y/o female here for a follow up visit. She has not been seen since 08/05/2015 at time of last colonoscopy. She was previously admitted in 06/2015 with abdominal pain in the LLQ followed by passing multiple episodes of red blood per rectum. Ct at that time showed descending colon inflammation. It was suspected she more than likely had ischemic colitis versus less likely infectious colitis. Her follow up colonoscopy on 08/05/15 showed left sided hyperplastic polyps, otherwise normal ileum and colon.   She is here today to discuss a few symptoms that are bothering her. She endorses a leakage of stool after bowel movement, usually for one to 3 hours after bowel movement she will week mucous or liquid stool. This ends up staining her underwear and uses pads to minimize the risk of this. She also endorses incontinence of gas, and has occasional urinary incontinence as well. She states her stools are never given once a day to every 3 days. She denies any blood in her stool. She does endorse sometimes she passes a stool which is very difficult to clean and makes the leakage worse. She denies any constipation routinely or straining. She does also complain of lower and upper abdominal pain and bloating. This has been ongoing for years and fluctuates in severity. Eating particular foods makes gas and bloating worse. She does find relief of her abdominal discomfort reliably with a bowel movement.  Labs on 02/25/17  TSH 4.9, WBC 5.0, Hgb 13.5, MCV 89, plt 262 Normal LFTs, Cr 0.78  Colonoscopy 08/05/2015 - 65mm sigmoid polyp, multiple left sided hyperplastic polyps, (all benign polyps, no adenomas) hypertrophied anal papillae, normal ileum and colon otherwise    Past Medical History:  Diagnosis Date  . CKD stage 2 due to type 2 diabetes mellitus ([place])   . Colitis 06/2015   admitted for suspected ischemic colitis  . DM2 (diabetes mellitus, type 2) ([place])   . GERD (gastroesophageal reflux disease)   .  High blood pressure   . Hyperlipemia   . Hypertension, renal 2016   [name]  . MS (multiple sclerosis) ([place])   . Thyroid disorder      Past Surgical History:  Procedure Laterality Date  . COLONOSCOPY    . WISDOM TOOTH EXTRACTION     Family History  Problem Relation Age of Onset  . Ovarian cancer Mother   . Lung cancer Father   . Cancer Maternal Grandmother   . Colon cancer Neg Hx    Social History   Tobacco Use  . Smoking status: Never Smoker  . Smokeless tobacco: Never Used  Substance Use Topics  . Alcohol use: No    Comment: caffeine drinks 1-2 daily  . Drug use: No   Current Outpatient Medications  Medication Sig Dispense Refill  . amLODipine (NORVASC) 5 MG tablet Take 5 mg by mouth daily.  1  . AVONEX PEN 30 MCG/0.5ML AJKT Inject 30 mcg into the muscle once a week. 4 each 11  . Calcium Carbonate-Vitamin D (CALCIUM + D PO) Take 1,000 mg by mouth daily.     . Cinnamon 500 MG TABS Take 1,000 mg by mouth 2 (two) times daily.     Marland Kitchen dicyclomine (BENTYL) 10 MG capsule Take 1 capsule (10 mg total) by mouth every 8 (eight) hours as needed for spasms. 30 capsule 3  . ESTRACE VAGINAL 0.1 MG/GM vaginal cream APPLY 1/2INCH IN VAGINAL OPENING EVERY NIGHT FOR 2WKS THEN APPLY TWICE WEEKLY UINTIL NEXT VISIT  3  . fexofenadine (ALLEGRA) 180 MG tablet Take 180 mg by mouth daily as needed.   0  . fluticasone (FLONASE) 50 MCG/ACT nasal spray fluticasone 50 mcg/actuation nasal spray,suspension  INHALE 1-2 SPRAYS IN EACH NOSTRIL 2 TIMES DAILY    . levothyroxine (SYNTHROID, LEVOTHROID) 88 MCG tablet Take 88 mcg by mouth daily.  12  . loratadine (CLARITIN) 10 MG tablet loratadine 10 mg tablet  TAKE 1 TABLET BY MOUTH EVERY DAY    . nystatin-triamcinolone (MYCOLOG II) cream Apply 1 application topically daily as needed (irration).   1  . simvastatin (ZOCOR) 5 MG tablet Take 5 mg by mouth every evening.     . trandolapril (MAVIK) 4 MG tablet Take 4 mg by mouth daily.  1  . trimethoprim  (TRIMPEX) 100 MG tablet Take 100 mg by mouth daily.     No current facility-administered medications for this visit.    Allergies  Allergen Reactions  . Sulfamethoxazole-Trimethoprim     REACTION: rash, taking trimethoprim now and is not having a problem. 01-05-15.     Review of Systems: All systems reviewed and negative except where noted in HPI.   Lab Results  Component Value Date   WBC 5.3 01/10/2017   HGB 13.1 01/10/2017   HCT 40.2 01/10/2017   MCV 86 01/10/2017   PLT 256 01/10/2017    Lab Results  Component Value Date   CREATININE 0.86 01/10/2017   BUN 13 01/10/2017   NA 139 01/10/2017   K 4.3 01/10/2017   CL 100 01/10/2017   CO2 24 01/10/2017    Lab Results  Component Value Date   ALT 16 01/10/2017   AST 23 01/10/2017   ALKPHOS 56 01/10/2017   BILITOT 0.3 01/10/2017     Physical Exam: BP 120/86   Pulse 80   Ht 5\' 7"  (1.702 m)   Wt 98.2 kg (216 lb 9.6 oz)   BMI 33.92 kg/m  Constitutional: Pleasant,well-developed, female in no acute distress. HEENT: Normocephalic and atraumatic. Conjunctivae are normal. No scleral icterus. Neck supple.  Cardiovascular: Normal rate, regular rhythm.  Pulmonary/chest: Effort normal and breath sounds normal. No wheezing, rales or rhonchi. Abdominal: Soft, nondistended, nontender.  There are no masses palpable. No hepatomegaly. DRE - normal external exam, low resting anal tone and squeeze pressure Extremities: no edema Lymphadenopathy: No cervical adenopathy noted. Neurological: Alert and oriented to person place and time. Skin: Skin is warm and dry. No rashes noted. Psychiatric: Normal mood and affect. Behavior is normal.   ASSESSMENT AND PLAN: 61 year old female here for reassessment of the following issues:  Incontinence of feces / gas / urine - she has low resting anal tone and squeeze pressure on DRE, I suspect she likely has weak muscles of the pelvic floor and anal canal causing these symptoms. Recent colonoscopy  normal. I'm recommending a referral to pelvic floor physical therapy to help treat this. In the interim recommend she try Citrucel once daily to help bulk stools and minimize risk of leakage (recommend Citrucel to minimize risk of bloating associated with other fiber supplements). If a course of pelvic floor physical therapy does not improve her symptoms she should contact me for reassessment. She agreed with the plan as outlined.  IBS - long-standing symptoms as outlined above. Will check celiac serology to ensure negative. Otherwise counseled her on a low FODMAP diet will see if this helps reduce her gas and bloating. Also will try her on Bentyl 10 mg every 8 hours as needed for  abdominal discomfort. Her symptoms persist she should contact me for reassessment, may consider a trial of rifaximin in this setting. She agreed with the plan as outlined.   [place] Cellar, MD [facility] Pager [phone]  CC: Thressa Sheller, MD

## 2017-05-03 LAB — TISSUE TRANSGLUTAMINASE, IGA: (tTG) Ab, IgA: 1 U/mL

## 2017-05-09 ENCOUNTER — Ambulatory Visit: Payer: Commercial Managed Care - PPO | Attending: Gastroenterology | Admitting: Physical Therapy

## 2017-05-09 ENCOUNTER — Encounter: Payer: Self-pay | Admitting: Physical Therapy

## 2017-05-09 DIAGNOSIS — M6281 Muscle weakness (generalized): Secondary | ICD-10-CM | POA: Insufficient documentation

## 2017-05-09 DIAGNOSIS — R279 Unspecified lack of coordination: Secondary | ICD-10-CM | POA: Insufficient documentation

## 2017-05-09 NOTE — Patient Instructions (Addendum)
   Squeeze anus as you squeeze the ball or pillow between knees, hold for count of 3 and then relax anus and legs Repeat 10x, do 3x/day  About Abdominal Massage  Abdominal massage, also called external colon massage, is a self-treatment circular massage technique that can reduce and eliminate gas and ease constipation. The colon naturally contracts in waves in a clockwise direction starting from inside the right hip, moving up toward the ribs, across the belly, and down inside the left hip.  When you perform circular abdominal massage, you help stimulate your colon's normal wave pattern of movement called peristalsis.  It is most beneficial when done after eating.  Positioning You can practice abdominal massage with oil while lying down, or in the shower with soap.  Some people find that it is just as effective to do the massage through clothing while sitting or standing.  How to Massage Start by placing your finger tips or knuckles on your right side, just inside your hip bone.  . Make small circular movements while you move upward toward your rib cage.   . Once you reach the bottom right side of your rib cage, take your circular movements across to the left side of the bottom of your rib cage.  . Next, move downward until you reach the inside of your left hip bone.  This is the path your feces travel in your colon. . Continue to perform your abdominal massage in this pattern for 10 minutes each day.     You can apply as much pressure as is comfortable in your massage.  Start gently and build pressure as you continue to practice.  Notice any areas of pain as you massage; areas of slight pain may be relieved as you massage, but if you have areas of significant or intense pain, consult with your healthcare provider.  Other Considerations . General physical activity including bending and stretching can have a beneficial massage-like effect on the colon.  Deep breathing can also stimulate the  colon because breathing deeply activates the same nervous system that supplies the colon.   . Abdominal massage should always be used in combination with a bowel-conscious diet that is high in the proper type of fiber for you, fluids (primarily water), and a regular exercise program.  [facility] [address], [place], [place] [address] Phone # [phone] Fax [phone]

## 2017-05-09 NOTE — Therapy (Addendum)
[facility] [address], [place], [place], [address] Phone: [phone]   Fax:  [phone]  Physical Therapy Evaluation  Patient Details  Name: Christina Carey MRN: [id number] Date of Birth: [birth year] Referring Provider: [place] Cellar, MD   Encounter Date: 05/09/2017  PT End of Session - 05/09/17 0823    Visit Number  1    Date for PT Re-Evaluation  07/04/17    PT Start Time  0803    PT Stop Time  0845    PT Time Calculation (min)  42 min    Activity Tolerance  Patient tolerated treatment well    Behavior During Therapy  [facility] for tasks assessed/performed       Past Medical History:  Diagnosis Date  . CKD stage 2 due to type 2 diabetes mellitus ([place])   . Colitis 06/2015   admitted for suspected ischemic colitis  . DM2 (diabetes mellitus, type 2) ([place])   . GERD (gastroesophageal reflux disease)   . High blood pressure   . Hyperlipemia   . Hypertension, renal 2016   [name]  . MS (multiple sclerosis) ([place])   . Thyroid disorder     Past Surgical History:  Procedure Laterality Date  . COLONOSCOPY    . WISDOM TOOTH EXTRACTION      There were no vitals filed for this visit.   Subjective Assessment - 05/09/17 0815    Subjective  I have had leakage for years, it is just getting to the point where it has been bothering me.  I have a BM mostly 1x/day sometimes every 3-4 days. It is sticky and soft when it leaks and will leak all day.  Patient denies excess pushing or forcing to have BM. I exercise 30 minutes x 5 days/week for cardio.    Limitations  House hold activities squatting    Patient Stated Goals  Be able to wait to get to the bathroom    Currently in Pain?  No/denies         [facility] PT Assessment - 05/09/17 0001      Assessment   Medical Diagnosis  R15.9 (ICD-10-CM) - Incontinence of feces, unspecified fecal incontinence type    Referring Provider   Cellar, MD    Onset  Date/Surgical Date  -- chronic    Prior Therapy  no      Precautions   Precautions  None      Restrictions   Weight Bearing Restrictions  No      Balance Screen   Has the patient fallen in the past 6 months  No      [place] residence    Living Arrangements  Spouse/significant other      Prior Function   Level of Independence  Independent      Cognition   Overall Cognitive Status  Within Functional Limits for tasks assessed      Posture/Postural Control   Posture/Postural Control  Postural limitations    Postural Limitations  Rounded Shoulders;Increased thoracic kyphosis      ROM / Strength   AROM / PROM / Strength  Strength      Strength   Strength Assessment Site  Hip    Right/Left Hip  Right;Left    Right Hip Flexion  4+/5    Right Hip External Rotation   5/5    Right Hip Internal Rotation  5/5    Right Hip ABduction  4+/5  Left Hip Flexion  4+/5    Left Hip External Rotation  5/5    Left Hip Internal Rotation  5/5    Left Hip ABduction  4/5             Objective measurements completed on examination: See above findings.    Pelvic Floor Special Questions - 05/09/17 0001    Prior Pelvic/Prostate Exam  No    Are you Pregnant or attempting pregnancy?  No    Prior Pregnancies  Yes    Number of Pregnancies  2    Number of Vaginal Deliveries  2    Any difficulty with labor and deliveries  No    Episiotomy Performed  Yes    Currently Sexually Active  No    Marinoff Scale  no problems    Urinary Leakage  Yes    How often  1x/day maybe about a couple drops    Pad use  1 liner/day    Activities that cause leaking  Coughing;Sneezing strain or getting out of bed (don't always leak)    Urinary urgency  Yes    Urinary frequency  4-5x/day    Fecal incontinence  Yes after BM can't clean and it keeps leaking; sticky and soft    Fluid intake  60 oz water    Caffeine beverages  1-2 cups coffee/day    Falling out feeling  (prolapse)  No    External Perineal Exam  normal    Skin Integrity  Intact    Prolapse  None    Pelvic Floor Internal Exam  pt informed and consent given to perform internal     Exam Type  Rectal    Strength  weak squeeze, no lift    Tone  low tone puborectalis and anal sphincters               PT Education - 05/09/17 0840    Education provided  Yes    Education Details  ball squeeze and abdominal massage    Person(s) Educated  Patient    Methods  Explanation;Demonstration;Verbal cues;Handout;Tactile cues    Comprehension  Verbalized understanding;Returned demonstration       PT Short Term Goals - 05/09/17 0904      PT SHORT TERM GOAL #1   Title  pt will be ind with initial HEP    Time  4    Period  Weeks    Status  New    Target Date  06/06/17      PT SHORT TERM GOAL #2   Title  able to have complete bowel movement without needing to clean excessively afterwards due to being able to empty bowel completely    Time  4    Period  Weeks    Status  New    Target Date  06/06/17        PT Long Term Goals - 05/09/17 0912      PT LONG TERM GOAL #1   Title  pt will have BM 1x/day with use of abdominal massage and toileting techniques    Time  8    Period  Weeks    Status  New    Target Date  07/04/17      PT LONG TERM GOAL #2   Title  Pt will report 50% less fecal and gas leakage due to increased strength    Time  8    Period  Weeks    Status  New  Target Date  07/04/17      PT LONG TERM GOAL #3   Title  pt will be independnet with advanced HEP    Time  8    Period  Weeks    Status  New    Target Date  07/04/17      PT LONG TERM GOAL #4   Title  Pt will be able to perform 3/5 MMT rectally and hold for 10 seconds in order to perform squat without leakage    Time  8    Period  Weeks    Status  New    Target Date  07/04/17             Plan - 05/09/17 9163    Clinical Impression Statement  Patient presents to the clinic due to fecal, gas, and  urine.  Patient's main goal is to reduce number of times needing to have BM and decrease fecal incontinence.  Pt demonstrates weak anal sphincter and puborectalis of 2/5MMT.  Pt has difficulty isolating pelvic floor muscle and attempts to hold with glutes.  Pt has low tone and low endurance of pelvic floor during strength testing.  She has some weakness bilteral LE. Pt presents with increased kyphosis and rounded shoulders.  She will benefitf rom skilled PT to address impairments and enable to her to have improved function with greater control of bowel and bladder.    History and Personal Factors relevant to plan of care:  Multiple sclerosis    Clinical Presentation  Stable    Clinical Decision Making  Low    Rehab Potential  Excellent    PT Frequency  2x / week decreased to 1x as able    PT Duration  8 weeks    PT Treatment/Interventions  ADLs/Self Care Home Management;Biofeedback;Cryotherapy;Electrical Stimulation;Moist Heat;Ultrasound;Therapeutic activities;Therapeutic exercise;Neuromuscular re-education;Patient/family education;Manual techniques;Passive range of motion;Dry needling;Taping    PT Next Visit Plan  toileting techniques, biofeedback, progress strength of core and pelvic floor, review abdominal massage, bulging    Consulted and Agree with Plan of Care  Patient       Patient will benefit from skilled therapeutic intervention in order to improve the following deficits and impairments:  Impaired tone, Decreased strength, Decreased activity tolerance  Visit Diagnosis: Muscle weakness (generalized)  Unspecified lack of coordination     Problem List Patient Active Problem List   Diagnosis Date Noted  . IBS (irritable bowel syndrome) 05/02/2017  . Fecal incontinence 05/02/2017  . Colitis 06/27/2015  . Encounter for therapeutic drug monitoring 01/05/2015  . MULTIPLE SCLEROSIS 05/26/2008  . HYPERTENSION 05/26/2008  . HIATAL HERNIA 05/26/2008  . CONSTIPATION, CHRONIC 05/26/2008   . HEMOCCULT POSITIVE STOOL 05/26/2008  . CHEST PAIN 05/26/2008  . ABDOMINAL PAIN, UNSPECIFIED SITE 05/26/2008    Zannie Cove, PT 05/09/2017, 9:41 AM  [facility] [address], [place], [place], [address] Phone: [phone]   Fax:  [phone]  Name: Christina Carey MRN: [id number] Date of Birth: May 22, 1955  PHYSICAL THERAPY DISCHARGE SUMMARY  Visits from Start of Care: 1  Current functional level related to goals / functional outcomes: See above details   Remaining deficits: See above details   Education / Equipment: HEP  Plan: Patient agrees to discharge.  Patient goals were not met. Patient is being discharged due to not returning since the last visit.  ?????     Pt only attended 1 visit (eval)  Zannie Cove, PT 07/10/17 10:04 AM

## 2017-05-23 ENCOUNTER — Ambulatory Visit: Payer: Commercial Managed Care - PPO | Admitting: Physical Therapy

## 2017-07-02 DIAGNOSIS — N3000 Acute cystitis without hematuria: Secondary | ICD-10-CM | POA: Diagnosis not present

## 2017-07-10 NOTE — Progress Notes (Signed)
[facility]  PATIENT: Christina Carey DOB: [birth year]   REASON FOR VISIT: Follow-up for relapsing remitting multiple sclerosis HISTORY FROM: Patient    HISTORY OF PRESENT ILLNESS:UPDATE 3/7/2019[name], 62 year old female returns for follow-up with history of relapsing remitting multiple sclerosis.  She is currently on Avonex pen and denies injection site issues.  She gets her medications through patient assistance.  She denies any speech or swallowing problems double vision, weakness, spasticity, sensory changes, or bowel or bladder difficulty.  She denies any falls.  Most recent MRI 01/21/2017 Abnormal MRI brain (with and without) demonstrating: 1. Few periventricular and subcortical and juxtacortical chronic demyelinating plaques. 2. No abnormal lesions are seen on post contrast views.   3. No change from MRI on 06/28/14.  She returns for reevaluation and lab.  Patient states she has been doing well  UPDATE 01/10/17 [name], 62 year old female returns for follow-up with history of multiple sclerosis. She is currently on Avonex and  denies injection site problems. She denies double vision weakness speech or swallowing difficulties she has not had any falls. Last MRI of the brain 06/28/2014 was abnormal with and without contrast showing foci within the white matter in a pattern and configuration consistent with the clinical diagnosis of multiple sclerosis no acute lesions. She does complain of clumsiness all her life .She complains of  balance issues, but as noted above no falls. She returns today for reevaluation and she will need labs to monitor liver function and side effects to Avonex. She has tried both tecfidera and Betaseron in the past for her MS. She needs repeat MRI of the brain to follow-up progression of her disease. She returns for reevaluation   HISTORY: She has a history of multiple sclerosis. She was on Betaseron but was tired of shots at her  last visit and was switched to tecfidera. When she eats a full meal with the medication she has much less flushing and abdominal symptoms however if she eats a light meal her flushing is worse. Most recent MRI 05/29/2012 with previous MS lesions not seen. She continues to have numbness which is intermittent on the top of the foot and she also has a pressure area over the third toe on the right with significant corns on the bottom of both feet. She denies any other sensory changes, focal weakness, speech or swallowing difficulty, double vision, blurred vision, balance issues   REVIEW OF SYSTEMS: Full 14 system review of systems performed and notable only for those listed, all others are neg:  Constitutional: neg  Cardiovascular: neg Ear/Nose/Throat: neg  Skin: neg Eyes: neg Respiratory: neg Gastroitestinal:  Hematology/Lymphatic: neg  Endocrine: neg Musculoskeletal:neg Allergy/Immunology: neg Neurological: neg Psychiatric: neg Sleep : neg   ALLERGIES: Allergies  Allergen Reactions  . Sulfamethoxazole-Trimethoprim     REACTION: rash, taking trimethoprim now and is not having a problem. 01-05-15.    HOME MEDICATIONS: Outpatient Medications Prior to Visit  Medication Sig Dispense Refill  . amLODipine (NORVASC) 5 MG tablet Take 5 mg by mouth daily.  1  . AVONEX PEN 30 MCG/0.5ML AJKT Inject 30 mcg into the muscle once a week. 4 each 11  . Calcium Carbonate-Vitamin D (CALCIUM + D PO) Take 1,000 mg by mouth daily.     . Cinnamon 500 MG TABS Take 1,000 mg by mouth 2 (two) times daily.     Marland Kitchen dicyclomine (BENTYL) 10 MG capsule Take 1 capsule (10 mg total) by mouth every 8 (eight) hours as needed  for spasms. 30 capsule 3  . ESTRACE VAGINAL 0.1 MG/GM vaginal cream APPLY 1/2INCH IN VAGINAL OPENING EVERY NIGHT FOR 2WKS THEN APPLY TWICE WEEKLY UINTIL NEXT VISIT  3  . fexofenadine (ALLEGRA) 180 MG tablet Take 180 mg by mouth daily as needed.   0  . fluticasone (FLONASE) 50 MCG/ACT nasal spray  fluticasone 50 mcg/actuation nasal spray,suspension  INHALE 1-2 SPRAYS IN EACH NOSTRIL 2 TIMES DAILY    . levothyroxine (SYNTHROID, LEVOTHROID) 88 MCG tablet Take 88 mcg by mouth daily.  12  . nystatin-triamcinolone (MYCOLOG II) cream Apply 1 application topically daily as needed (irration).   1  . simvastatin (ZOCOR) 5 MG tablet Take 5 mg by mouth every evening.     . trandolapril (MAVIK) 4 MG tablet Take 4 mg by mouth daily.  1  . loratadine (CLARITIN) 10 MG tablet loratadine 10 mg tablet  TAKE 1 TABLET BY MOUTH EVERY DAY    . trimethoprim (TRIMPEX) 100 MG tablet Take 100 mg by mouth daily.     No facility-administered medications prior to visit.     PAST MEDICAL HISTORY: Past Medical History:  Diagnosis Date  . CKD stage 2 due to type 2 diabetes mellitus ([place])   . Colitis 06/2015   admitted for suspected ischemic colitis  . DM2 (diabetes mellitus, type 2) ([place])   . GERD (gastroesophageal reflux disease)   . High blood pressure   . Hyperlipemia   . Hypertension, renal 2016   [name]  . MS (multiple sclerosis) ([place])   . Thyroid disorder     PAST SURGICAL HISTORY: Past Surgical History:  Procedure Laterality Date  . COLONOSCOPY    . WISDOM TOOTH EXTRACTION      FAMILY HISTORY: Family History  Problem Relation Age of Onset  . Ovarian cancer Mother   . Lung cancer Father   . Cancer Maternal Grandmother   . Colon cancer Neg Hx     SOCIAL HISTORY: Social History   Socioeconomic History  . Marital status: Married    Spouse name: Purcell Nails  . Number of children: 2  . Years of education: 56  . Highest education level: Not on file  Social Needs  . Financial resource strain: Not on file  . Food insecurity - worry: Not on file  . Food insecurity - inability: Not on file  . Transportation needs - medical: Not on file  . Transportation needs - non-medical: Not on file  Occupational History  . Occupation: ACCOUNTING    Employer: [employer]  Tobacco Use  .  Smoking status: Never Smoker  . Smokeless tobacco: Never Used  Substance and Sexual Activity  . Alcohol use: No    Comment: caffeine drinks 1-2 daily  . Drug use: No  . Sexual activity: Not on file  Other Topics Concern  . Not on file  Social History Narrative   Patient lives at home with her husband Farrel Gordon. and they have 2 adult children. 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    Patient is right- handed.   Patient drinks one cup of coffee daily and soda very rarely.     PHYSICAL EXAM  Vitals:   07/11/17 0756  BP: (!) 142/77  Pulse: 94  Weight: 214 lb 12.8 oz (97.4 kg)   Body mass index is 33.64 kg/m. Generalized: Well developed, obese female in no acute distress  Head: normocephalic and atraumatic,. Oropharynx benign  Neck: Supple, no carotid bruits  Cardiac: Regular  rate rhythm, no murmur  Musculoskeletal: No deformity  Skin, no rash or petechiae  Neurological examination   Mentation: Alert oriented to time, place, history taking. Follows all commands speech and language fluent  Cranial nerve II-XII: Visual acuity 20/30 bilaterally .Fundoscopic exam reveals sharp disc margins.Pupils were equal round reactive to light extraocular movements were full, visual field were full on confrontational test. Facial sensation and strength were normal. hearing was intact to finger rubbing bilaterally. Uvula tongue midline. head turning and shoulder shrug were normal and symmetric.Tongue protrusion into cheek strength was normal. Motor: normal bulk and tone, full strength in the BUE, BLE, Sensory: normal and symmetric to light touch , vibratory and pinprick in the upper and lower extremities Coordination: finger-nose-finger, heel-to-shin bilaterally, no dysmetria, no tremor Reflexes: Brachioradialis 2/2, biceps 2/2, triceps 2/2, patellar 2/2, Achilles 2/2, plantar responses were flexor bilaterally. Gait and Station: Rising up from seated position  without assistance, normal stance, moderate stride, good arm swing, smooth turning, able to perform tiptoe, and heel walking without difficulty. Tandem gait is steady DIAGNOSTIC DATA (LABS, IMAGING, TESTING) - I reviewed patient records, labs, notes, testing and imaging myself where available.  Lab Results  Component Value Date   WBC 5.3 01/10/2017   HGB 13.1 01/10/2017   HCT 40.2 01/10/2017   MCV 86 01/10/2017   PLT 256 01/10/2017      Component Value Date/Time   NA 139 01/10/2017 1008   K 4.3 01/10/2017 1008   CL 100 01/10/2017 1008   CO2 24 01/10/2017 1008   GLUCOSE 84 01/10/2017 1008   GLUCOSE 97 06/26/2015 1538   BUN 13 01/10/2017 1008   CREATININE 0.86 01/10/2017 1008   CALCIUM 9.4 01/10/2017 1008   PROT 7.1 01/10/2017 1008   ALBUMIN 4.4 01/10/2017 1008   AST 23 01/10/2017 1008   ALT 16 01/10/2017 1008   ALKPHOS 56 01/10/2017 1008   BILITOT 0.3 01/10/2017 1008   GFRNONAA 74 01/10/2017 1008   GFRAA 85 01/10/2017 1008    ASSESSMENT AND PLAN 62 y.o. year old female has a past medical history of  MS (multiple sclerosis). here to follow-up. She is currently on Avonex weekly IM without exacerbation of her MS symptoms or injection site issues. Marland Kitchen MRI of the brain 01/21/17  was abnormal MRI of the brain with and without contrast showing Few periventricular and subcortical and juxtacortical chronic demyelinating plaques.No abnormal lesions are seen on post contrast views.  No change from MRI on 06/28/14.  PLAN: Continue Avonex weekly gets pt. assistance  Obtain CBC and CMP to monitor adverse effects of Avonex  Follow-up in 6 months Call for any exacerbation of symptoms  Dennie Bible, [facility], [facility], APRN  [facility] [address], [place], [place] [address] [phone]

## 2017-07-11 ENCOUNTER — Ambulatory Visit: Payer: Commercial Managed Care - PPO | Admitting: Nurse Practitioner

## 2017-07-11 ENCOUNTER — Encounter: Payer: Self-pay | Admitting: Nurse Practitioner

## 2017-07-11 VITALS — BP 142/77 | HR 94 | Wt 214.8 lb

## 2017-07-11 DIAGNOSIS — G35 Multiple sclerosis: Secondary | ICD-10-CM | POA: Diagnosis not present

## 2017-07-11 DIAGNOSIS — Z5181 Encounter for therapeutic drug level monitoring: Secondary | ICD-10-CM

## 2017-07-11 NOTE — Patient Instructions (Signed)
Continue Avonex weekly gets pt. assistance  Obtain CBC and CMP to monitor adverse effects of Avonex  Follow-up in 6 months Call for any exacerbation of symptoms

## 2017-07-11 NOTE — Progress Notes (Signed)
I have read the note, and I agree with the clinical assessment and plan.  Izabel Chim K Naelle Diegel   

## 2017-07-12 LAB — COMPREHENSIVE METABOLIC PANEL
A/G RATIO: 1.4 (ref 1.2–2.2)
ALK PHOS: 63 IU/L (ref 39–117)
ALT: 16 IU/L (ref 0–32)
AST: 19 IU/L (ref 0–40)
Albumin: 4.3 g/dL (ref 3.6–4.8)
BILIRUBIN TOTAL: 0.3 mg/dL (ref 0.0–1.2)
BUN/Creatinine Ratio: 15 (ref 12–28)
BUN: 11 mg/dL (ref 8–27)
CHLORIDE: 103 mmol/L (ref 96–106)
CO2: 27 mmol/L (ref 20–29)
Calcium: 9.6 mg/dL (ref 8.7–10.3)
Creatinine, Ser: 0.75 mg/dL (ref 0.57–1.00)
GFR calc non Af Amer: 87 mL/min/{1.73_m2} (ref >59)
GFR, EST AFRICAN AMERICAN: 100 mL/min/{1.73_m2} (ref >59)
Globulin, Total: 3 g/dL (ref 1.5–4.5)
Glucose: 92 mg/dL (ref 65–99)
POTASSIUM: 4.4 mmol/L (ref 3.5–5.2)
Sodium: 142 mmol/L (ref 134–144)
TOTAL PROTEIN: 7.3 g/dL (ref 6.0–8.5)

## 2017-07-12 LAB — CBC WITH DIFFERENTIAL/PLATELET
BASOS ABS: 0 10*3/uL (ref 0.0–0.2)
Basos: 1 %
EOS (ABSOLUTE): 0.2 10*3/uL (ref 0.0–0.4)
Eos: 3 %
Hematocrit: 43.1 % (ref 34.0–46.6)
Hemoglobin: 13.8 g/dL (ref 11.1–15.9)
Immature Grans (Abs): 0 10*3/uL (ref 0.0–0.1)
Immature Granulocytes: 0 %
LYMPHS ABS: 1.1 10*3/uL (ref 0.7–3.1)
Lymphs: 18 %
MCH: 28.3 pg (ref 26.6–33.0)
MCHC: 32 g/dL (ref 31.5–35.7)
MCV: 89 fL (ref 79–97)
Monocytes Absolute: 0.5 10*3/uL (ref 0.1–0.9)
Monocytes: 9 %
NEUTROS ABS: 4 10*3/uL (ref 1.4–7.0)
Neutrophils: 69 %
PLATELETS: 279 10*3/uL (ref 150–379)
RBC: 4.87 x10E6/uL (ref 3.77–5.28)
RDW: 14.7 % (ref 12.3–15.4)
WBC: 5.7 10*3/uL (ref 3.4–10.8)

## 2017-07-15 ENCOUNTER — Telehealth: Payer: Self-pay | Admitting: *Deleted

## 2017-07-15 NOTE — Telephone Encounter (Signed)
Spoke to pt and relayed stable labs, she verbalized understanding.

## 2017-07-15 NOTE — Telephone Encounter (Signed)
-----   Message from Christina Bible, NP sent at 07/12/2017  8:08 AM EST ----- Labs stable please call the patient

## 2017-07-23 DIAGNOSIS — Z01419 Encounter for gynecological examination (general) (routine) without abnormal findings: Secondary | ICD-10-CM | POA: Diagnosis not present

## 2017-07-23 DIAGNOSIS — Z1231 Encounter for screening mammogram for malignant neoplasm of breast: Secondary | ICD-10-CM | POA: Diagnosis not present

## 2017-08-14 ENCOUNTER — Encounter: Payer: Self-pay | Admitting: Nurse Practitioner

## 2017-08-14 DIAGNOSIS — H35363 Drusen (degenerative) of macula, bilateral: Secondary | ICD-10-CM | POA: Diagnosis not present

## 2017-08-14 DIAGNOSIS — H40013 Open angle with borderline findings, low risk, bilateral: Secondary | ICD-10-CM | POA: Diagnosis not present

## 2017-08-14 DIAGNOSIS — H524 Presbyopia: Secondary | ICD-10-CM | POA: Diagnosis not present

## 2017-08-14 DIAGNOSIS — E119 Type 2 diabetes mellitus without complications: Secondary | ICD-10-CM | POA: Diagnosis not present

## 2017-08-16 ENCOUNTER — Other Ambulatory Visit: Payer: Self-pay | Admitting: Nurse Practitioner

## 2017-08-21 ENCOUNTER — Telehealth: Payer: Self-pay | Admitting: Nurse Practitioner

## 2017-08-21 NOTE — Telephone Encounter (Signed)
Continue to monitor for glaucoma Cataracts OU mild not visually significant Diabetes no retinopathy Patient to try new glasses for 2 weeks for any issues call their office

## 2017-11-01 ENCOUNTER — Other Ambulatory Visit: Payer: Self-pay | Admitting: Neurology

## 2017-11-17 DIAGNOSIS — K529 Noninfective gastroenteritis and colitis, unspecified: Secondary | ICD-10-CM | POA: Diagnosis not present

## 2017-11-17 DIAGNOSIS — K922 Gastrointestinal hemorrhage, unspecified: Secondary | ICD-10-CM | POA: Diagnosis not present

## 2017-11-17 DIAGNOSIS — K625 Hemorrhage of anus and rectum: Secondary | ICD-10-CM | POA: Diagnosis not present

## 2017-11-17 DIAGNOSIS — R109 Unspecified abdominal pain: Secondary | ICD-10-CM | POA: Diagnosis not present

## 2018-01-02 ENCOUNTER — Ambulatory Visit: Payer: Commercial Managed Care - PPO | Admitting: Gastroenterology

## 2018-01-02 ENCOUNTER — Encounter: Payer: Self-pay | Admitting: Gastroenterology

## 2018-01-02 VITALS — BP 128/80 | HR 92 | Ht 67.0 in | Wt 214.4 lb

## 2018-01-02 DIAGNOSIS — K559 Vascular disorder of intestine, unspecified: Secondary | ICD-10-CM | POA: Diagnosis not present

## 2018-01-02 NOTE — Patient Instructions (Signed)
Consider taking Bentyl regularly for chronic abdominal pain.

## 2018-01-02 NOTE — Progress Notes (Signed)
01/02/2018 Christina Carey [id number] May 13, 1955   HISTORY OF PRESENT ILLNESS: This is a 62 year old female who is known to Dr. Havery Moros.  She is here today for hospital/ER follow-up.  In July she was visiting [facility] when she had sudden onset of abdominal pain and then diarrhea in the middle the night.  Diarrhea continued for several hours along with the pain.  Eventually diarrhea stopped and she started passing significant amount of blood per rectum.  She went to the emergency department.  CT scan of the abdomen pelvis showed colitis likely infectious or inflammatory in etiology by the report.  She does not have any history of chronic colitis.  She had one other episode of similar symptoms in February 2017 that Dr. Havery Moros reportedly thought was ischemic in etiology.  Follow-up colonoscopy in March 2017 showed the colitis had resolved.  Anyway, she was treated with antibiotics and given some Bentyl for her symptoms.  By the time she left the emergency department she had been feeling better.  The remaining part of her vacation she had some mild nausea, but otherwise did just fine.  She is feeling back to normal at this point.  She does report chronic constant abdominal pain for which she had been given Bentyl in the past.  Extensive GI evaluation of the years has been unrevealing as to cause of her pain, thought to be IBS in origin.  Past Medical History:  Diagnosis Date  . CKD stage 2 due to type 2 diabetes mellitus ([place])   . Colitis 06/2015   admitted for suspected ischemic colitis  . DM2 (diabetes mellitus, type 2) ([place])   . GERD (gastroesophageal reflux disease)   . High blood pressure   . Hyperlipemia   . Hypertension, renal 2016   [name]  . MS (multiple sclerosis) ([place])   . Thyroid disorder    Past Surgical History:  Procedure Laterality Date  . COLONOSCOPY    . WISDOM TOOTH EXTRACTION      reports that she has never smoked. She has never used smokeless  tobacco. She reports that she does not drink alcohol or use drugs. family history includes Cancer in her maternal grandmother; Lung cancer in her father; Ovarian cancer in her mother. Allergies  Allergen Reactions  . Sulfamethoxazole-Trimethoprim     REACTION: rash, taking trimethoprim now and is not having a problem. 01-05-15.      Outpatient Encounter Medications as of 01/02/2018  Medication Sig  . amLODipine (NORVASC) 5 MG tablet Take 5 mg by mouth daily.  . AVONEX PEN 30 MCG/0.5ML AJKT INJECT 30 MCG INTRAMUSCULARLY ONCE WEEKLY  . Calcium Carbonate-Vitamin D (CALCIUM + D PO) Take 1,000 mg by mouth daily.   Marland Kitchen CINNAMON PO Take 1,000 mg by mouth 2 (two) times daily.  Marland Kitchen dicyclomine (BENTYL) 10 MG capsule Take 1 capsule (10 mg total) by mouth every 8 (eight) hours as needed for spasms.  Adora Fridge VAGINAL 0.1 MG/GM vaginal cream 1 Applicatorful as needed.   Marland Kitchen levothyroxine (SYNTHROID, LEVOTHROID) 88 MCG tablet Take 88 mcg by mouth daily.  Marland Kitchen nystatin-triamcinolone (MYCOLOG II) cream Apply 1 application topically daily as needed (irration).   . simvastatin (ZOCOR) 5 MG tablet Take 5 mg by mouth every evening.   . trandolapril (MAVIK) 4 MG tablet Take 4 mg by mouth daily.  . fexofenadine (ALLEGRA) 180 MG tablet Take 180 mg by mouth daily as needed.   . [DISCONTINUED] Cinnamon 500 MG TABS Take 1,000 mg by  mouth 2 (two) times daily.   . [DISCONTINUED] fluticasone (FLONASE) 50 MCG/ACT nasal spray fluticasone 50 mcg/actuation nasal spray,suspension  INHALE 1-2 SPRAYS IN EACH NOSTRIL 2 TIMES DAILY   No facility-administered encounter medications on file as of 01/02/2018.      REVIEW OF SYSTEMS  : All other systems reviewed and negative except where noted in the History of Present Illness.   PHYSICAL EXAM: BP 128/80   Pulse 92   Ht 5\' 7"  (1.702 m)   Wt 214 lb 6 oz (97.2 kg)   BMI 33.58 kg/m  General: Well developed white female in no acute distress Head: Normocephalic and atraumatic Eyes:   Sclerae anicteric, conjunctiva pink. Ears: Normal auditory acuity Lungs: Clear throughout to auscultation; no increased WOB. Heart: Regular rate and rhythm Abdomen: Soft, non-distended.  BS present.  Mild diffuse TTP. Musculoskeletal: Symmetrical with no gross deformities  Skin: No lesions on visible extremities Extremities: No edema  Neurological: Alert oriented x 4, grossly non-focal Psychological:  Alert and cooperative. Normal mood and affect  ASSESSMENT AND PLAN: *Ischemic colitis:  Had acute colitis seen by CT scan in July at [facility].  Either infectious or ischemic and it appears that she had one other episode in the past that Dr. Havery Moros thought may have been ischemic.  Symptoms completely resolved. *Chronic abdominal pain:  Has long-standing constant abdominal discomfort.  Has Bentyl that she has been given in the past.  We discussed considering taking this on a regular basis for a period of time to see if it helps.   CC:  Thressa Sheller, MD

## 2018-01-05 NOTE — Progress Notes (Signed)
Agree with assessment as outlined with the following thoughts. 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 Unclear why this has seemed to have recurred, on review of prior CT scan there does not seem to be any significant vascular abnormalities to set her up for this. Per review of ACG guidelines on ischemic colitis, there is moderate evidence for interferon to be a risk factor for the development of ischemic colitis, there have been multiple case reports suggesting this association, and she needs to be aware of this and follow up with her Neurologist about this issue to determine if she should continue this long term. I will send them a message about this issue. In addition, given her suspected recurrent ischemic colitis, she should have a thrombophilia workup, per guidelines, to include testing for deficiencies for protein C, protein S, and antithrombin III, as well as testing for prothrombin mutation and for antiphospholipid syndrome.  Jess, can you help coordinate lab testing for thrombophilia with testing as outlined above, and let her know I recommend she follow up with her Neurologist to discuss the Avonex, as it could be related. Thanks

## 2018-01-09 NOTE — Progress Notes (Signed)
[facility]  PATIENT: Christina Carey DOB: 1955/06/14   REASON FOR VISIT: Follow-up for relapsing remitting multiple sclerosis HISTORY FROM: Patient    HISTORY OF PRESENT ILLNESS: UPDATE 01/13/2018 [name] 62 year old female returns for follow-up with a history of relapsing remitting multiple sclerosis.  She is currently on Avonex denies injection site issues she gets her medications through patient assistance.  Unfortunately she has had 2 episodes of ischemic colitis 05/2015 and 1 more recently.   Dr.Armbruster, her GI doctor would like her to get off interferons since he feels there may be a correlation.  I discussed different oral preparations with the patient.  She decided on tecfidera.  She was made aware that it can cause some stomach issues and flushing however she wants to try that first and then if she fails that medication she will try Gilenya.  She returns for reevaluation she has had no exacerbation of her symptoms.  Most recent MRI September 2018 without change from February 2016.  No other interval medical issues.  She continues to work full-time  UPDATE 3/7/2019[name], 62 year old female returns for follow-up with history of relapsing remitting multiple sclerosis.  She is currently on Avonex pen and denies injection site issues.  She gets her medications through patient assistance.  She denies any speech or swallowing problems double vision, weakness, spasticity, sensory changes, or bowel or bladder difficulty.  She denies any falls.  Most recent MRI 01/21/2017 Abnormal MRI brain (with and without) demonstrating: 1. Few periventricular and subcortical and juxtacortical chronic demyelinating plaques. 2. No abnormal lesions are seen on post contrast views.   3. No change from MRI on 06/28/14.  She returns for reevaluation and lab.  Patient states she has been doing well  UPDATE 01/10/17 [name], 62 year old female returns for follow-up with  history of multiple sclerosis. She is currently on Avonex and  denies injection site problems. She denies double vision weakness speech or swallowing difficulties she has not had any falls. Last MRI of the brain 06/28/2014 was abnormal with and without contrast showing foci within the white matter in a pattern and configuration consistent with the clinical diagnosis of multiple sclerosis no acute lesions. She does complain of clumsiness all her life .She complains of  balance issues, but as noted above no falls. She returns today for reevaluation and she will need labs to monitor liver function and side effects to Avonex. She has tried both tecfidera and Betaseron in the past for her MS. She needs repeat MRI of the brain to follow-up progression of her disease. She returns for reevaluation   HISTORY: She has a history of multiple sclerosis. She was on Betaseron but was tired of shots at her last visit and was switched to tecfidera. When she eats a full meal with the medication she has much less flushing and abdominal symptoms however if she eats a light meal her flushing is worse. Most recent MRI 05/29/2012 with previous MS lesions not seen. She continues to have numbness which is intermittent on the top of the foot and she also has a pressure area over the third toe on the right with significant corns on the bottom of both feet. She denies any other sensory changes, focal weakness, speech or swallowing difficulty, double vision, blurred vision, balance issues   REVIEW OF SYSTEMS: Full 14 system review of systems performed and notable only for those listed, all others are neg:  Constitutional: neg  Cardiovascular: neg Ear/Nose/Throat: neg  Skin: neg Eyes: neg Respiratory: neg Gastroitestinal: Ischemic colitis Hematology/Lymphatic: neg  Endocrine: neg Musculoskeletal:neg Allergy/Immunology: neg Neurological: neg Psychiatric: neg Sleep : neg   ALLERGIES: Allergies  Allergen Reactions  .  Sulfamethoxazole-Trimethoprim     REACTION: rash, taking trimethoprim now and is not having a problem. 01-05-15.    HOME MEDICATIONS: Outpatient Medications Prior to Visit  Medication Sig Dispense Refill  . amLODipine (NORVASC) 5 MG tablet Take 5 mg by mouth daily.  1  . AVONEX PEN 30 MCG/0.5ML AJKT INJECT 30 MCG INTRAMUSCULARLY ONCE WEEKLY 1 each 10  . Calcium Carbonate-Vitamin D (CALCIUM + D PO) Take 1,000 mg by mouth daily.     Marland Kitchen CINNAMON PO Take 1,000 mg by mouth 2 (two) times daily.    Marland Kitchen dicyclomine (BENTYL) 10 MG capsule Take 1 capsule (10 mg total) by mouth every 8 (eight) hours as needed for spasms. 30 capsule 3  . ESTRACE VAGINAL 0.1 MG/GM vaginal cream 1 Applicatorful as needed.   3  . fexofenadine (ALLEGRA) 180 MG tablet Take 180 mg by mouth daily as needed.   0  . levothyroxine (SYNTHROID, LEVOTHROID) 88 MCG tablet Take 88 mcg by mouth daily.  12  . nystatin-triamcinolone (MYCOLOG II) cream Apply 1 application topically daily as needed (irration).   1  . simvastatin (ZOCOR) 5 MG tablet Take 5 mg by mouth every evening.     . trandolapril (MAVIK) 4 MG tablet Take 4 mg by mouth daily.  1   No facility-administered medications prior to visit.     PAST MEDICAL HISTORY: Past Medical History:  Diagnosis Date  . CKD stage 2 due to type 2 diabetes mellitus ([place])   . Colitis 06/2015   admitted for suspected ischemic colitis  . DM2 (diabetes mellitus, type 2) ([place])   . GERD (gastroesophageal reflux disease)   . High blood pressure   . Hyperlipemia   . Hypertension, renal 2016   Christina Carey  . MS (multiple sclerosis) ([place])   . Thyroid disorder     PAST SURGICAL HISTORY: Past Surgical History:  Procedure Laterality Date  . COLONOSCOPY    . WISDOM TOOTH EXTRACTION      FAMILY HISTORY: Family History  Problem Relation Age of Onset  . Ovarian cancer Mother   . Lung cancer Father   . Cancer Maternal Grandmother   . Colon cancer Neg Hx     SOCIAL HISTORY: Social  History   Socioeconomic History  . Marital status: Married    Spouse name: Christina Carey  . Number of children: 2  . Years of education: 39  . Highest education level: Not on file  Occupational History  . Occupation: ACCOUNTING    Employer: [employer]  Social Needs  . Financial resource strain: Not on file  . Food insecurity:    Worry: Not on file    Inability: Not on file  . Transportation needs:    Medical: Not on file    Non-medical: Not on file  Tobacco Use  . Smoking status: Never Smoker  . Smokeless tobacco: Never Used  Substance and Sexual Activity  . Alcohol use: No    Comment: caffeine drinks 1-2 daily  . Drug use: No  . Sexual activity: Not on file  Lifestyle  . Physical activity:    Days per week: Not on file    Minutes per session: Not on file  . Stress: Not on file  Relationships  . Social connections:    Talks on phone: Not  on file    Gets together: Not on file    Attends religious service: Not on file    Active member of club or organization: Not on file    Attends meetings of clubs or organizations: Not on file    Relationship status: Not on file  . Intimate partner violence:    Fear of current or ex partner: Not on file    Emotionally abused: Not on file    Physically abused: Not on file    Forced sexual activity: Not on file  Other Topics Concern  . Not on file  Social History Narrative   Patient lives at home with her husband Farrel Gordon. and they have 2 adult children. 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    Patient is right- handed.   Patient drinks one cup of coffee daily and soda very rarely.     PHYSICAL EXAM  Vitals:   01/13/18 0720  BP: 127/80  Pulse: 75  Weight: 214 lb (97.1 kg)  Height: 5\' 7"  (1.702 m)   Body mass index is 33.52 kg/m. Generalized: Well developed, obese female in no acute distress  Head: normocephalic and atraumatic,. Oropharynx benign  Neck: Supple,  Musculoskeletal: No deformity    Skin, no rash or petechiae  Neurological examination   Mentation: Alert oriented to time, place, history taking. Follows all commands speech and language fluent  Cranial nerve II-XII: Visual acuity 20/30 bilaterally .Fundoscopic exam reveals sharp disc margins.Pupils were equal round reactive to light extraocular movements were full, visual field were full on confrontational test. Facial sensation and strength were normal. hearing was intact to finger rubbing bilaterally. Uvula tongue midline. head turning and shoulder shrug were normal and symmetric.Tongue protrusion into cheek strength was normal. Motor: normal bulk and tone, full strength in the BUE, BLE, Sensory: normal and symmetric to light touch , vibratory and pinprick in the upper and lower extremities Coordination: finger-nose-finger, heel-to-shin bilaterally, no dysmetria, no tremor Reflexes: Brachioradialis 2/2, biceps 2/2, triceps 2/2, patellar 2/2, Achilles 2/2, plantar responses were flexor bilaterally. Gait and Station: Rising up from seated position without assistance, normal stance, moderate stride, good arm swing, smooth turning, able to perform tiptoe, and heel walking without difficulty. Tandem gait is steady DIAGNOSTIC DATA (LABS, IMAGING, TESTING) - I reviewed patient records, labs, notes, testing and imaging myself where available.  Lab Results  Component Value Date   WBC 5.7 07/11/2017   HGB 13.8 07/11/2017   HCT 43.1 07/11/2017   MCV 89 07/11/2017   PLT 279 07/11/2017      Component Value Date/Time   NA 142 07/11/2017 0829   K 4.4 07/11/2017 0829   CL 103 07/11/2017 0829   CO2 27 07/11/2017 0829   GLUCOSE 92 07/11/2017 0829   GLUCOSE 97 06/26/2015 1538   BUN 11 07/11/2017 0829   CREATININE 0.75 07/11/2017 0829   CALCIUM 9.6 07/11/2017 0829   PROT 7.3 07/11/2017 0829   ALBUMIN 4.3 07/11/2017 0829   AST 19 07/11/2017 0829   ALT 16 07/11/2017 0829   ALKPHOS 63 07/11/2017 0829   BILITOT 0.3  07/11/2017 0829   GFRNONAA 87 07/11/2017 0829   GFRAA 100 07/11/2017 0829    ASSESSMENT AND PLAN 62 y.o. year old female has a past medical history of  MS (multiple sclerosis). here to follow-up. She is currently on Avonex weekly IM without exacerbation of her MS symptoms or injection site issues. Marland Kitchen MRI of the brain 01/21/17  was abnormal  MRI of the brain with and without contrast showing Few periventricular and subcortical and juxtacortical chronic demyelinating plaques.No abnormal lesions are seen on post contrast views.  No change from MRI on 06/28/14.  Unfortunately she has had 2 bouts of ischemic colitis in the last 2 years and her GI physician would like her to change to a different medication other than an interferon.  Discussed oral preparations with patient  PLAN: Will switch to Tecfidera , side effects of the medication reviewed and given a copy to patient Obtain CBC and CMP prior to initiation of therapy  Follow-up in 6 months Call for any exacerbation of symptoms If patient fails tecfidera then she will be set up for [facility], [facility], [facility], [facility] [address] [place], [place] [address] [phone]

## 2018-01-10 ENCOUNTER — Telehealth: Payer: Self-pay

## 2018-01-10 DIAGNOSIS — K559 Vascular disorder of intestine, unspecified: Secondary | ICD-10-CM

## 2018-01-10 NOTE — Telephone Encounter (Signed)
-----   Message from Loralie Champagne, PA-C sent at 01/10/2018 10:10 AM EDT -----   ----- Message ----- From: Yetta Flock, MD Sent: 01/05/2018   8:27 PM EDT To: Loralie Champagne, PA-C    ----- Message ----- From: Loralie Champagne, PA-C Sent: 01/02/2018   5:16 PM EDT To: Yetta Flock, MD

## 2018-01-10 NOTE — Telephone Encounter (Signed)
Author: Loralie Champagne, PA-C Service: Gastroenterology Author Type: Physician Assistant  Filed: 01/10/2018 10:10 AM Encounter Date: 01/02/2018 Status: Signed  Editor: Zehr, Laban Emperor, PA-C (Physician Assistant)     Show:Clear all [x] Manual[] Template[x] Copied  Added by: [x] Zehr, Laban Emperor, PA-C  [] Hover for details Please let the patient know that I discussed her case with Dr. Havery Moros and due to her recurrence in this suspected ischemic colitis he did some research and there has been evidence that Avonex can be associated with recurrent ischemic colitis.  She needs to discuss this with her neurologist and see what they think and if it needs to be continued.  In addition, he thinks that she should have a thrombophilia workup to be sure there is no abnormal clotting issues.  Needs labs for protein C deficiency, protein S deficiency, antithrombin III, prothrombin mutation, and for antiphospholipid Ab syndrome.  Thank you,  Jess

## 2018-01-10 NOTE — Progress Notes (Signed)
Working on it.  Thank you for the input!

## 2018-01-10 NOTE — Progress Notes (Signed)
Please let the patient know that I discussed her case with Dr. Havery Moros and due to her recurrence in this suspected ischemic colitis he did some research and there has been evidence that Avonex can be associated with recurrent ischemic colitis.  She needs to discuss this with her neurologist and see what they think and if it needs to be continued.  In addition, he thinks that she should have a thrombophilia workup to be sure there is no abnormal clotting issues.  Needs labs for protein C deficiency, protein S deficiency, antithrombin III, prothrombin mutation, and for antiphospholipid Ab syndrome.  Thank you,  Jess

## 2018-01-10 NOTE — Telephone Encounter (Signed)
The pt has been advised of the recommendations per Dr Havery Moros and labs have been entered into Epic  She will come in on Monday and has a follow up with Neuro on Monday as well

## 2018-01-10 NOTE — Telephone Encounter (Signed)
Left message on machine to call back  

## 2018-01-13 ENCOUNTER — Other Ambulatory Visit: Payer: Commercial Managed Care - PPO

## 2018-01-13 ENCOUNTER — Encounter: Payer: Self-pay | Admitting: Nurse Practitioner

## 2018-01-13 ENCOUNTER — Ambulatory Visit: Payer: Commercial Managed Care - PPO | Admitting: Nurse Practitioner

## 2018-01-13 VITALS — BP 127/80 | HR 75 | Ht 67.0 in | Wt 214.0 lb

## 2018-01-13 DIAGNOSIS — K559 Vascular disorder of intestine, unspecified: Secondary | ICD-10-CM

## 2018-01-13 DIAGNOSIS — G35 Multiple sclerosis: Secondary | ICD-10-CM | POA: Diagnosis not present

## 2018-01-13 DIAGNOSIS — Z5181 Encounter for therapeutic drug level monitoring: Secondary | ICD-10-CM | POA: Diagnosis not present

## 2018-01-13 NOTE — Progress Notes (Signed)
I have read the note, and I agree with the clinical assessment and plan.  Roniyah Llorens K Alicen Donalson   

## 2018-01-13 NOTE — Patient Instructions (Signed)
Will switch to Tecfidera  Obtain CBC and CMP prior to initiation of therapy  Follow-up in 6 months Call for any exacerbation of symptoms

## 2018-01-14 ENCOUNTER — Telehealth: Payer: Self-pay | Admitting: *Deleted

## 2018-01-14 LAB — CBC WITH DIFFERENTIAL/PLATELET
BASOS ABS: 0.1 10*3/uL (ref 0.0–0.2)
BASOS: 1 %
EOS (ABSOLUTE): 0.2 10*3/uL (ref 0.0–0.4)
EOS: 4 %
HEMATOCRIT: 42.9 % (ref 34.0–46.6)
HEMOGLOBIN: 13.7 g/dL (ref 11.1–15.9)
IMMATURE GRANS (ABS): 0 10*3/uL (ref 0.0–0.1)
Immature Granulocytes: 0 %
LYMPHS ABS: 1.4 10*3/uL (ref 0.7–3.1)
LYMPHS: 26 %
MCH: 28.2 pg (ref 26.6–33.0)
MCHC: 31.9 g/dL (ref 31.5–35.7)
MCV: 88 fL (ref 79–97)
MONOCYTES: 11 %
Monocytes Absolute: 0.6 10*3/uL (ref 0.1–0.9)
NEUTROS ABS: 3.1 10*3/uL (ref 1.4–7.0)
Neutrophils: 58 %
Platelets: 277 10*3/uL (ref 150–450)
RBC: 4.86 x10E6/uL (ref 3.77–5.28)
RDW: 14.1 % (ref 12.3–15.4)
WBC: 5.3 10*3/uL (ref 3.4–10.8)

## 2018-01-14 LAB — COMPREHENSIVE METABOLIC PANEL
ALT: 15 IU/L (ref 0–32)
AST: 15 IU/L (ref 0–40)
Albumin/Globulin Ratio: 1.5 (ref 1.2–2.2)
Albumin: 4.3 g/dL (ref 3.6–4.8)
Alkaline Phosphatase: 61 IU/L (ref 39–117)
BILIRUBIN TOTAL: 0.3 mg/dL (ref 0.0–1.2)
BUN / CREAT RATIO: 15 (ref 12–28)
BUN: 12 mg/dL (ref 8–27)
CALCIUM: 9.8 mg/dL (ref 8.7–10.3)
CO2: 26 mmol/L (ref 20–29)
CREATININE: 0.8 mg/dL (ref 0.57–1.00)
Chloride: 105 mmol/L (ref 96–106)
GFR, EST AFRICAN AMERICAN: 92 mL/min/{1.73_m2} (ref >59)
GFR, EST NON AFRICAN AMERICAN: 80 mL/min/{1.73_m2} (ref >59)
GLUCOSE: 74 mg/dL (ref 65–99)
Globulin, Total: 2.9 g/dL (ref 1.5–4.5)
Potassium: 4.9 mmol/L (ref 3.5–5.2)
Sodium: 145 mmol/L — ABNORMAL HIGH (ref 134–144)
TOTAL PROTEIN: 7.2 g/dL (ref 6.0–8.5)

## 2018-01-14 NOTE — Progress Notes (Signed)
Tecfidera start form faxed to Christina Carey.

## 2018-01-14 NOTE — Telephone Encounter (Signed)
Spoke with patient and informed her that her labs are stable. Advised her the Tecfidera start form was faxed to [facility] yesterday. She will get a call from them re: insurance coverage, patient assistance. She verbalized understanding, appreciation.

## 2018-01-15 LAB — PROTEIN S PANEL
PROTEIN S ACTIVITY: 86 % (ref 63–140)
PROTEIN S AG FREE: 69 % (ref 57–157)
PROTEIN S AG TOTAL: 90 % (ref 60–150)

## 2018-01-15 LAB — PROTEIN C DEFICIENCY PROFILE
Protein C Activity: 123 % (ref 73–180)
Protein C Antigen: 113 % (ref 60–150)

## 2018-01-16 LAB — ANTIPHOSPHOLIPID SYNDROME DIAGNOSTIC PANEL
Anticardiolipin IgG: <14 [GPL'U] (ref <=14)
Beta-2 Glyco 1 IgM: <9 SMU (ref <=20)
Beta-2 Glyco I IgG: <9 SGU (ref <=20)
PTT-LA SCREEN: 31 s (ref <=40)
dRVVT: 36 s (ref <=45)

## 2018-01-16 LAB — PROTHROMBIN GENE MUTATION

## 2018-01-16 LAB — ANTITHROMBIN III: ANTITHROMB III FUNC: 113 %{activity} (ref 80–120)

## 2018-01-29 ENCOUNTER — Telehealth: Payer: Self-pay

## 2018-01-29 NOTE — Telephone Encounter (Signed)
Received paperwork from [place] in regards to Christina Carey using Avonex. Paperwork has been filled out by Cecille Rubin, NP and faxed back to [place].

## 2018-01-29 NOTE — Telephone Encounter (Signed)
Pending approval for Tecfidera Starter pack Key: [id number] [facility]

## 2018-01-30 ENCOUNTER — Telehealth: Payer: Self-pay

## 2018-01-30 DIAGNOSIS — Z5181 Encounter for therapeutic drug level monitoring: Secondary | ICD-10-CM

## 2018-01-30 DIAGNOSIS — G35 Multiple sclerosis: Secondary | ICD-10-CM

## 2018-01-30 NOTE — Addendum Note (Signed)
Addended by: Kathrynn Ducking on: 01/30/2018 05:12 PM   Modules accepted: Orders

## 2018-01-30 NOTE — Telephone Encounter (Signed)
Noted, I will be happy to do an appeal letter if needed.

## 2018-01-30 NOTE — Telephone Encounter (Signed)
Fax confirmation received for [place] Adverse Event Report Case [id number].  [phone]. sy

## 2018-01-30 NOTE — Telephone Encounter (Signed)
PA for Tecfidera was denied due to plan exclusion. We can proceed with an appeal or would you like recommend another medication for the patient. Please advise.

## 2018-01-30 NOTE — Telephone Encounter (Signed)
Pt was notified of denial. She is not sure what she wants to do, try tecfidera or another medication. She is aware Keirra is out of the office until Monday, she would like to talk with Dr Jannifer Franklin in the interim.

## 2018-01-30 NOTE — Telephone Encounter (Signed)
Received a fax from [place] stating that they have provided [facility] a quick start dispense for her Tecfidera while we wait for insurance company.  [facility] Contact information  Telephone number: [phone] Fax number: [phone]

## 2018-01-30 NOTE — Telephone Encounter (Signed)
Received a call from Larena Glassman from [employer] [phone] wanting to know that status of Ms. Goheen's Tecfidera PA. I advised her that the PA was denied and we are in the process of figuring out the next steps. She stated that she would call [employer] to see if there was anything that can be done. She also mentioned that she would call us back if something changes on her end.

## 2018-01-30 NOTE — Telephone Encounter (Signed)
I have prepared record release form for pt to sign to get records from Dr. Kathlen Mody and Pierre Bali start form for pt to sign

## 2018-01-30 NOTE — Telephone Encounter (Signed)
I called the patient.  The Tecfidera has been denied, we are appealing this but the patient is now concerned about the GI side effects of the Tecfidera, she is leaning towards wanting to start Gilenya.  I will put in blood work for the [facility], she will come in and sign the form, she has already seen Dr. Kathlen Mody from ophthalmology within the last 2 to 3 months, will get this report.

## 2018-01-31 ENCOUNTER — Telehealth: Payer: Self-pay

## 2018-01-31 NOTE — Telephone Encounter (Signed)
RN has start form for [facility] for patient to sign.

## 2018-01-31 NOTE — Telephone Encounter (Signed)
Pt has called re: wanting to be switched to Gilenya , pt states on the 9th of Sept.she had blood work done and would like to know if that could be used or does she need to come back in for more blood work.  Please call

## 2018-01-31 NOTE — Telephone Encounter (Signed)
Thomes Cake, CMA 16 minutes ago (9:36 AM)      Spoke with Ms. Sliwinski she has some concerns on whether she needs to have labs done since she just had them drawn on September 9th. She also wanted to know whether or not a appointment is needed to start [place]. Please advise       Documentation

## 2018-01-31 NOTE — Telephone Encounter (Signed)
Me       12:46 PM  Note    Rn spoke with Dr. Jannifer Franklin. Dr. Jannifer Franklin stated pt needs to submit eye report from Dr.Scott Kathlen Mody, get more lab work that was order yesterday. Also sign the start form for Gilenya. Pt verbalized understanding and wrote down what RN stated

## 2018-01-31 NOTE — Telephone Encounter (Signed)
Spoke with Christina Carey she has some concerns on whether she needs to have labs done since she just had them drawn on September 9th. She also wanted to know whether or not a appointment is needed to start [place]. Please advise

## 2018-01-31 NOTE — Telephone Encounter (Addendum)
Rn spoke with Dr. Jannifer Franklin. Dr. Jannifer Franklin stated pt needs to submit eye report from Dr.Scott Kathlen Mody, get more lab work that was order yesterday. Also sign the start form for Gilenya. Rn call patient to tell her DR. Willis needs eye report from Richardson Dopp MD, labs to be done that are already in the system that her order this week, and the start form needs to be sign. PT verbalized understanding and wrote down the instructions to get eye report, labs, and sign start form for Gilenya. Pt will get labs done on 02/03/2018,

## 2018-02-03 ENCOUNTER — Other Ambulatory Visit (INDEPENDENT_AMBULATORY_CARE_PROVIDER_SITE_OTHER): Payer: Self-pay

## 2018-02-03 DIAGNOSIS — Z0289 Encounter for other administrative examinations: Secondary | ICD-10-CM

## 2018-02-03 DIAGNOSIS — Z5181 Encounter for therapeutic drug level monitoring: Secondary | ICD-10-CM

## 2018-02-03 NOTE — Telephone Encounter (Signed)
Patient has agreed to start on Gilenya instead of Tecfidera.

## 2018-02-03 NOTE — Addendum Note (Signed)
Addended by: Rossie Muskrat L on: 02/03/2018 11:18 AM   Modules accepted: Orders

## 2018-02-03 NOTE — Telephone Encounter (Signed)
Pt came today for labwork. She also signed Gilenya start form. Explained process to her. I provided her with Gilenya rx patient packet information. We will wait to get labs results back. Advised we received Herbert Deaner eye report and given to Dr. Jannifer Franklin to review.  We will send in start form to [facility] and send starter product rx to Dr. Einar Gip office where she will have an initial consult and have her FDO for gilenya. Asked her to call back if she has further questions/concerns. She verbalized understanding.

## 2018-02-03 NOTE — Telephone Encounter (Signed)
Christina Carey [phone] has advised [facility] said they could not find a matching patient therefore she is wanting to confirm the insurance. Please call to advise  REF# [id number]

## 2018-02-03 NOTE — Telephone Encounter (Signed)
revised 

## 2018-02-03 NOTE — Telephone Encounter (Signed)
Faxed completed/signed start form to [facility] at [phone]. Received fax confirmation.

## 2018-02-03 NOTE — Telephone Encounter (Signed)
noted 

## 2018-02-04 NOTE — Telephone Encounter (Signed)
Received fax from [place] that they received start form and processing for pt.

## 2018-02-06 ENCOUNTER — Other Ambulatory Visit: Payer: Self-pay | Admitting: Neurology

## 2018-02-06 ENCOUNTER — Telehealth: Payer: Self-pay | Admitting: *Deleted

## 2018-02-06 DIAGNOSIS — G35 Multiple sclerosis: Secondary | ICD-10-CM

## 2018-02-06 LAB — QUANTIFERON-TB GOLD PLUS
QUANTIFERON TB1 AG VALUE: 0.04 [IU]/mL
QuantiFERON Nil Value: 0.04 IU/mL
QuantiFERON TB2 Ag Value: 0.04 IU/mL
QuantiFERON-TB Gold Plus: NEGATIVE

## 2018-02-06 LAB — HEPATITIS C ANTIBODY: Hep C Virus Ab: <0.1 s/co ratio (ref 0.0–0.9)

## 2018-02-06 LAB — VARICELLA ZOSTER ANTIBODY, IGG: VARICELLA: 1569 {index} (ref >165)

## 2018-02-06 LAB — HEPATITIS B SURFACE ANTIBODY,QUALITATIVE: HEP B SURFACE AB, QUAL: NONREACTIVE

## 2018-02-06 NOTE — Telephone Encounter (Addendum)
Called patient back. Relayed labs looked okay. Advised I will forward results to Dr. Einar Gip. She has her initial consult with Dr. Einar Gip (cardioligst) scheduled to be cleared to start FDO. She will call back if she has further questions or concerns.

## 2018-02-06 NOTE — Telephone Encounter (Signed)
-----   Message from Kathrynn Ducking, MD sent at 02/06/2018  8:07 AM EDT ----- Blood work is OK, patient cleared for FDO, I will make the referral. ----- Message ----- From: Interface, [facility] Lab Results In Sent: 02/04/2018   7:38 AM EDT To: Kathrynn Ducking, MD

## 2018-02-18 ENCOUNTER — Telehealth: Payer: Self-pay | Admitting: *Deleted

## 2018-02-18 NOTE — Telephone Encounter (Signed)
Received form from [place] go program to clear pt for FDO. I called Dr. Einar Gip office. Pt is scheduled to see Dr. Einar Gip for initial consult on 02/20/18 to be cleared for FDO. Gave to Dr. Jannifer Franklin as Juluis Rainier.

## 2018-02-20 DIAGNOSIS — I10 Essential (primary) hypertension: Secondary | ICD-10-CM | POA: Diagnosis not present

## 2018-02-20 DIAGNOSIS — Z5181 Encounter for therapeutic drug level monitoring: Secondary | ICD-10-CM | POA: Diagnosis not present

## 2018-02-20 DIAGNOSIS — G35 Multiple sclerosis: Secondary | ICD-10-CM | POA: Diagnosis not present

## 2018-02-21 NOTE — Telephone Encounter (Signed)
Received OV note from Dr. Einar Gip office. Dr. Jannifer Franklin reviewed and cleared patient for FDO. I faxed signed form back to Gilenya go program clearing patient for FDO. Fax: [phone]. Received fax confirmation.

## 2018-03-14 ENCOUNTER — Encounter: Payer: Self-pay | Admitting: Nurse Practitioner

## 2018-03-26 ENCOUNTER — Telehealth: Payer: Self-pay | Admitting: *Deleted

## 2018-03-26 MED ORDER — FINGOLIMOD HCL 0.5 MG PO CAPS: 0.5000 mg | ORAL_CAPSULE | Freq: Every day | ORAL | 3 refills | Status: DC

## 2018-03-26 NOTE — Telephone Encounter (Signed)
Gilenya rx. escribed to [facility] in response to faxed request from them/fim

## 2018-03-27 ENCOUNTER — Telehealth: Payer: Self-pay | Admitting: *Deleted

## 2018-03-27 NOTE — Telephone Encounter (Signed)
Initiated PA for Gilenya on cmm, key [id number]. Failed Avonex- colitis, Betaseron - injection fatigue, tecfidera side effects.  [facility] is reviewing your PA request. Typically an electronic response will be received within 72 hours.

## 2018-03-28 NOTE — Telephone Encounter (Signed)
Received fax from [facility]: PA not required. Gilenya is on patient's plan's list  of covered drugs. If pharmacy has questions, may call [facility] help desk, [phone].

## 2018-04-01 NOTE — Telephone Encounter (Addendum)
Received call from Mateo Flow with [place] go program. She stated she called [facility] to check on patient's gilenya. She was told insurance is rejecting it due to a quantity limit of 28 for 28 days. She stated the pharmacy is running Rx for 30 for 30 days. She advised this RN call  [facility] [phone]. Called [facility], spoke with  [place] who spoke with pharmacist. She stated a PA for quantity limit exception must be started. She asked clinical questions and gave pa ref #[id number]. Turn around is 48 hours, reply will be faxed to this office.

## 2018-04-07 NOTE — Telephone Encounter (Signed)
3 weekend and holiday phone calls from [facility] after hours for PRE-AUTHORIZATION from " Christina Carey" reg this patient . Please clarify !   [phone]

## 2018-04-07 NOTE — Telephone Encounter (Signed)
Fax received from [facility] phone# [phone].   Gilenya 0.5mg  capsules are approved for claim dollar exception for 12 mos. thru 04/02/19./fim

## 2018-04-09 ENCOUNTER — Telehealth: Payer: Self-pay | Admitting: *Deleted

## 2018-04-09 MED ORDER — FINGOLIMOD HCL 0.5 MG PO CAPS: 0.5000 mg | ORAL_CAPSULE | Freq: Every day | ORAL | 3 refills | Status: DC

## 2018-04-09 NOTE — Telephone Encounter (Signed)
Fax received from [facility] requesting Gilenya rx. be faxed to [place]. Rx. escribed as requested/fim

## 2018-04-11 NOTE — Telephone Encounter (Signed)
Received fax from [place], re: gilenya PA. Faxed the letter from [place] dated 03/27/18 stating Gilenya is on patient's plan list of covered drugs to [facility].  No PA needed.

## 2018-04-14 NOTE — Telephone Encounter (Signed)
Fax confirmation to [employer] [phone] concerning repeated notice from [place] about pts gilenya. [phone] [facility].

## 2018-04-17 ENCOUNTER — Other Ambulatory Visit: Payer: Self-pay | Admitting: *Deleted

## 2018-04-17 MED ORDER — FINGOLIMOD HCL 0.5 MG PO CAPS: 0.5000 mg | ORAL_CAPSULE | Freq: Every day | ORAL | 11 refills | Status: DC

## 2018-04-17 NOTE — Telephone Encounter (Signed)
Spoke to [employer], [place] and she relayed that they spoke to pt 04-16-18 and she is aware of change.  [phone].  Tommie Sams [phone] and [facility] for gilenya.

## 2018-04-17 NOTE — Telephone Encounter (Signed)
Pt called with [facility] asking that a script be sent to them for her.  Pt is asking for a call from RN to discuss her gilenya

## 2018-04-18 MED ORDER — FINGOLIMOD HCL 0.5 MG PO CAPS: 0.5000 mg | ORAL_CAPSULE | Freq: Every day | ORAL | 11 refills | Status: DC

## 2018-04-18 NOTE — Addendum Note (Signed)
Addended by: Brandon Melnick on: 04/18/2018 01:49 PM   Modules accepted: Orders

## 2018-04-18 NOTE — Telephone Encounter (Addendum)
Megan with [facility] stating she is returning RNs call- Jinny Blossom advised of change of pharmacy no call back needed

## 2018-04-18 NOTE — Telephone Encounter (Signed)
I spoke to pt.  It seems that her specialty pharmacy is not [facility], [facility]. Apparently there is no specialty pharmacy willing to get her medication.  She has had her FDO 03-13-18 and been getting drug thru [place] go program since.   ? 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  Pt has been in touch with [facility], and is to f/u with them/ and her work to see what can be done.   She will let us know.

## 2018-05-12 ENCOUNTER — Telehealth: Payer: Self-pay

## 2018-05-12 NOTE — Telephone Encounter (Signed)
Received fax from [facility] stating Gilenya 0.5 mg cap is pending Rx coverage verification, but no action required from our office at this time. MB RN

## 2018-05-20 NOTE — Telephone Encounter (Signed)
I called pt, following up on issue of getting her refills of gilenya.  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  She has gotten it from [facility] now and will be getting it from them.  She appreciated call.

## 2018-07-15 ENCOUNTER — Telehealth: Payer: Self-pay

## 2018-07-15 NOTE — Telephone Encounter (Signed)
Received a fax from [facility] requesting verification on pt's gilenya Rx. Fax sent back stating the pt is currently taking 0.5 mg of gilenya 1 capsule daily.  F # [id number]. Confirmation received.

## 2018-12-18 ENCOUNTER — Telehealth: Payer: Self-pay | Admitting: Nurse Practitioner

## 2018-12-18 NOTE — Telephone Encounter (Signed)
Noted will wait for fax

## 2018-12-18 NOTE — Telephone Encounter (Signed)
[facility] has called to 1 inform RN Jinny Blossom that shes sending form for script and. And a referral for pt assistance. Please call

## 2018-12-22 NOTE — Telephone Encounter (Addendum)
I have received the forms from [facility]~ forms have been faxed and confirmation has been received. Dr. Felecia Shelling signed for Dr. Jannifer Franklin due to Dr. Jannifer Franklin being out of the office.

## 2018-12-24 NOTE — Telephone Encounter (Signed)
[facility] faxed a new SRF over and asked [place] to complete the new SRF. Dr. Krista Blue signed new SRF for Gilenya and I have faxed it back to [facility]. (Dr. Jannifer Franklin is out of the office today.) Received a receipt of confirmation.   Pt is overdue for an appt. I called pt. She is agreeable to an appt with Amy, NP on 01/01/19 at 8:00am. Pt verbalized understanding of new appt date and time.

## 2018-12-31 NOTE — Progress Notes (Signed)
PATIENT: Christina Carey DOB: January 28, 1956  REASON FOR VISIT: follow up HISTORY FROM: patient  Chief Complaint  Patient presents with   Follow-up    Room 8, MS F/u.  " Been doing fine"      HISTORY OF PRESENT ILLNESS: Today 01/01/19 Christina Carey is a 63 y.o. female here today for follow up of MS. She was started on Gilenya at last follow up in 01/2018 due to possible correlation of ischemic colitis on Avonex. She reports doing very well on Gilenya. She denies vision changes, changes in bowel or bladder habits or changes in gait. She is tolerating medication well. She reports feeling well and without complaints today. Last MRI 2018. Eye exam on 08/19/2018 and was reportedly normal.    HISTORY: (copied from [facility] note on 01/13/2018)   UPDATE 01/13/2018 CM [name] 63 year old female returns for follow-up with a history of relapsing remitting multiple sclerosis.  She is currently on Avonex denies injection site issues she gets her medications through patient assistance.  Unfortunately she has had 2 episodes of ischemic colitis 05/2015 and 1 more recently.   Dr.Armbruster, her GI doctor would like her to get off interferons since he feels there may be a correlation.  I discussed different oral preparations with the patient.  She decided on tecfidera.  She was made aware that it can cause some stomach issues and flushing however she wants to try that first and then if she fails that medication she will try Gilenya.  She returns for reevaluation she has had no exacerbation of her symptoms.  Most recent MRI September 2018 without change from February 2016.  No other interval medical issues.  She continues to work full-time  UPDATE 3/7/2019CM [name], 63 year old female returns for follow-up with history of relapsing remitting multiple sclerosis.  She is currently on Avonex pen and denies injection site issues.  She gets her medications through patient assistance.  She denies any  speech or swallowing problems double vision, weakness, spasticity, sensory changes, or bowel or bladder difficulty.  She denies any falls.  Most recent MRI 01/21/2017 Abnormal MRI brain (with and without) demonstrating: 1. Few periventricular and subcortical and juxtacortical chronic demyelinating plaques. 2. No abnormal lesions are seen on post contrast views.  3. No change from MRI on 06/28/14.  She returns for reevaluation and lab.  Patient states she has been doing well  UPDATE 01/10/17 CM [name], 63 year old female returns for follow-up with history of multiple sclerosis. She is currently on Avonex and  denies injection site problems. She denies double vision weakness speech or swallowing difficulties she has not had any falls. Last MRI of the brain 06/28/2014 was abnormal with and without contrast showing foci within the white matter in a pattern and configuration consistent with the clinical diagnosis of multiple sclerosis no acute lesions. She does complain of clumsiness all her life .She complains of  balance issues, but as noted above no falls. She returns today for reevaluation and she will need labs to monitor liver function and side effects to Avonex. She has tried both tecfidera and Betaseron in the past for her MS. She needs repeat MRI of the brain to follow-up progression of her disease. She returns for reevaluation  HISTORY: She has a history of multiple sclerosis. She was on Betaseron but was tired of shots at her last visit and was switched to tecfidera. When she eats a full meal with the medication she has much less flushing and  abdominal symptoms however if she eats a light meal her flushing is worse. Most recent MRI 05/29/2012 with previous MS lesions not seen. She continues to have numbness which is intermittent on the top of the foot and she also has a pressure area over the third toe on the right with significant corns on the bottom of both feet. She denies any other sensory  changes, focal weakness, speech or swallowing difficulty, double vision, blurred vision, balance issues   REVIEW OF SYSTEMS: Out of a complete 14 system review of symptoms, the patient complains only of the following symptoms, none and all other reviewed systems are negative.  ALLERGIES: Allergies  Allergen Reactions   Sulfamethoxazole-Trimethoprim     REACTION: rash, taking trimethoprim now and is not having a problem. 01-05-15.    HOME MEDICATIONS: Outpatient Medications Prior to Visit  Medication Sig Dispense Refill   Calcium Carbonate-Vitamin D (CALCIUM + D PO) Take 1,000 mg by mouth daily.      CINNAMON PO Take 1,000 mg by mouth 2 (two) times daily.     ESTRACE VAGINAL 0.1 MG/GM vaginal cream 1 Applicatorful as needed.   3   fexofenadine (ALLEGRA) 180 MG tablet Take 180 mg by mouth daily as needed.   0   Fingolimod HCl (GILENYA) 0.5 MG CAPS Take 1 capsule (0.5 mg total) by mouth daily. 30 capsule 11   Fingolimod HCl (GILENYA) 0.5 MG CAPS gilenya  0.5 mg caps     levothyroxine (SYNTHROID, LEVOTHROID) 88 MCG tablet Take 88 mcg by mouth daily.  12   nystatin-triamcinolone (MYCOLOG II) cream Apply 1 application topically daily as needed (irration).   1   simvastatin (ZOCOR) 5 MG tablet Take 5 mg by mouth every evening.      trandolapril (MAVIK) 4 MG tablet Take 4 mg by mouth daily.  1   amLODipine (NORVASC) 5 MG tablet Take 5 mg by mouth daily.  1   dicyclomine (BENTYL) 10 MG capsule Take 1 capsule (10 mg total) by mouth every 8 (eight) hours as needed for spasms. 30 capsule 3   No facility-administered medications prior to visit.     PAST MEDICAL HISTORY: Past Medical History:  Diagnosis Date   CKD stage 2 due to type 2 diabetes mellitus ([place])    Colitis 06/2015   admitted for suspected ischemic colitis   DM2 (diabetes mellitus, type 2) (HCC)    GERD (gastroesophageal reflux disease)    High blood pressure    Hyperlipemia    Hypertension, renal 2016    [name]   MS (multiple sclerosis) [facility])    Thyroid disorder     PAST SURGICAL HISTORY: Past Surgical History:  Procedure Laterality Date   COLONOSCOPY     WISDOM TOOTH EXTRACTION      FAMILY HISTORY: Family History  Problem Relation Age of Onset   Ovarian cancer Mother    Lung cancer Father    Cancer Maternal Grandmother    Colon cancer Neg Hx     SOCIAL HISTORY: Social History   Socioeconomic History   Marital status: Married    Spouse name: Lawrence   Number of children: 2   Years of education: 16   Highest education level: Not on file  Occupational History   Occupation: ACCOUNTING    Employer: [employer] resource strain: Not on file   Food insecurity    Worry: Not on file    Inability: Not on file   Transportation needs  Medical: Not on file    Non-medical: Not on file  Tobacco Use   Smoking status: Never Smoker   Smokeless tobacco: Never Used  Substance and Sexual Activity   Alcohol use: No    Comment: caffeine drinks 1-2 daily   Drug use: No   Sexual activity: Not on file  Lifestyle   Physical activity    Days per week: Not on file    Minutes per session: Not on file   Stress: Not on file  Relationships   Social connections    Talks on phone: Not on file    Gets together: Not on file    Attends religious service: Not on file    Active member of club or organization: Not on file    Attends meetings of clubs or organizations: Not on file    Relationship status: Not on file   Intimate partner violence    Fear of current or ex partner: Not on file    Emotionally abused: Not on file    Physically abused: Not on file    Forced sexual activity: Not on file  Other Topics Concern   Not on file  Social History Narrative   Patient lives at home with her husband Farrel Gordon. and they have 2 adult children. 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    Patient is right-  handed.   Patient drinks one cup of coffee daily and soda very rarely.      PHYSICAL EXAM  Vitals:   01/01/19 0800  BP: 139/85  Pulse: 78  Temp: 98 F (36.7 C)  Weight: 211 lb 3.2 oz (95.8 kg)  Height: 5\' 7"  (1.702 m)   Body mass index is 33.08 kg/m.  Generalized: Well developed, in no acute distress  Cardiology: normal rate and rhythm, no murmur noted Neurological examination  Mentation: Alert oriented to time, place, history taking. Follows all commands speech and language fluent Cranial nerve II-XII: Pupils were equal round reactive to light. Extraocular movements were full, visual field were full on confrontational test. Facial sensation and strength were normal. Uvula tongue midline. Head turning and shoulder shrug  were normal and symmetric. Motor: The motor testing reveals 5 over 5 strength of all 4 extremities. Good symmetric motor tone is noted throughout.  Sensory: Sensory testing is intact to soft touch on all 4 extremities. No evidence of extinction is noted.  Coordination: Cerebellar testing reveals good finger-nose-finger and heel-to-shin bilaterally.  Gait and station: Gait is normal.   DIAGNOSTIC DATA (LABS, IMAGING, TESTING) - I reviewed patient records, labs, notes, testing and imaging myself where available.  No flowsheet data found.   Lab Results  Component Value Date   WBC 5.3 01/13/2018   HGB 13.7 01/13/2018   HCT 42.9 01/13/2018   MCV 88 01/13/2018   PLT 277 01/13/2018      Component Value Date/Time   NA 145 (H) 01/13/2018 0822   K 4.9 01/13/2018 0822   CL 105 01/13/2018 0822   CO2 26 01/13/2018 0822   GLUCOSE 74 01/13/2018 0822   GLUCOSE 97 06/26/2015 1538   BUN 12 01/13/2018 0822   CREATININE 0.80 01/13/2018 0822   CALCIUM 9.8 01/13/2018 0822   PROT 7.2 01/13/2018 0822   ALBUMIN 4.3 01/13/2018 0822   AST 15 01/13/2018 0822   ALT 15 01/13/2018 0822   ALKPHOS 61 01/13/2018 0822   BILITOT 0.3 01/13/2018 0822   GFRNONAA 80 01/13/2018  0822   GFRAA 92  01/13/2018 [id number]   No results found for: CHOL, HDL, LDLCALC, LDLDIRECT, TRIG, CHOLHDL No results found for: HGBA1C No results found for: VITAMINB12 No results found for: TSH    ASSESSMENT AND PLAN 63 y.o. year old female  has a past medical history of CKD stage 2 due to type 2 diabetes mellitus ([place]), Colitis (06/2015), DM2 (diabetes mellitus, type 2) ([place]), GERD (gastroesophageal reflux disease), High blood pressure, Hyperlipemia, Hypertension, renal (2016), MS (multiple sclerosis) ([place]), and Thyroid disorder. here with     ICD-10-CM   1. MULTIPLE SCLEROSIS  G35     Ell[place]e is doing very well on Gilenya.  She denies adverse effects.  No new symptoms of numbness, vision changes, bladder/bowel or gait changes.  We will request copy of last eye exam in April that was reportedly normal.  She was encouraged to continue regular follow-up.  We will update labs today.  Last MRI 2018.  I will reorder today to assess following medication change.  We have discussed typical follow-up in 6 months, however, patient wishes to follow-up in 1 year.  She is aware to call with any new or worsening concerns.  She verbalizes understanding and agreement with this plan.   No orders of the defined types were placed in this encounter.    No orders of the defined types were placed in this encounter.     I spent 15 minutes with the patient. 50% of this time was spent counseling and educating patient on plan of care and medications.    Debbora Presto, FNP-C 01/01/2019, 8:06 AM [facility] [address], [place], [place] [address] [phone]

## 2019-01-01 ENCOUNTER — Ambulatory Visit: Payer: Commercial Managed Care - PPO | Admitting: Family Medicine

## 2019-01-01 ENCOUNTER — Encounter: Payer: Self-pay | Admitting: Family Medicine

## 2019-01-01 ENCOUNTER — Other Ambulatory Visit: Payer: Self-pay

## 2019-01-01 VITALS — BP 139/85 | HR 78 | Temp 98.0°F | Ht 67.0 in | Wt 211.2 lb

## 2019-01-01 DIAGNOSIS — G35 Multiple sclerosis: Secondary | ICD-10-CM

## 2019-01-01 NOTE — Patient Instructions (Signed)
Continue Gilenya as prescribed.   We will get eye exam faxed to [place]  Labs today and will order MRI  Follow up in 6-12 months  Multiple Sclerosis Multiple sclerosis (MS) is a disease of the brain, spinal cord, and optic nerves (central nervous system). It causes the body's disease-fighting (immune) system to destroy the protective covering (myelin sheath) around nerves in the brain. When this happens, signals (nerve impulses) going to and from the brain and spinal cord do not get sent properly or may not get sent at all. There are several types of MS:  Relapsing-remitting MS. This is the most common type. This causes sudden attacks of symptoms. After an attack, you may recover completely until the next attack, or some symptoms may remain permanently.  Secondary progressive MS. This usually develops after the onset of relapsing-remitting MS. Similar to relapsing-remitting MS, this type also causes sudden attacks of symptoms. Attacks may be less frequent, but symptoms slowly get worse (progress) over time.  Primary progressive MS. This causes symptoms that steadily progress over time. This type of MS does not cause sudden attacks of symptoms. The age of onset of MS varies, but it often develops between 14-16 years of age. MS is a lifelong (chronic) condition. There is no cure, but treatment can help slow down the progression of the disease. What are the causes? The cause of this condition is not known. What increases the risk? You are more likely to develop this condition if:  You are a woman.  You have a relative with MS. However, the condition is not passed from parent to child (inherited).  You have a lack (deficiency) of vitamin D.  You smoke. MS is more common in the [place] than in the [place]. What are the signs or symptoms? Relapsing-remitting and secondary progressive MS cause symptoms to occur in episodes or attacks that may last weeks to months.  There may be long periods between attacks in which there are almost no symptoms. Primary progressive MS causes symptoms to steadily progress after they develop. Symptoms of MS vary because of the many different ways it affects the central nervous system. The main symptoms include:  Vision problems and eye pain.  Numbness.  Weakness.  Inability to move your arms, hands, feet, or legs (paralysis).  Balance problems.  Shaking that you cannot control (tremors).  Muscle spasms.  Problems with thinking (cognitive changes). MS can also cause symptoms that are associated with the disease, but are not always the direct result of an MS attack. They may include:  Inability to control urination or bowel movements (incontinence).  Headaches.  Fatigue.  Inability to tolerate heat.  Emotional changes.  Depression.  Pain. How is this diagnosed? This condition is diagnosed based on:  Your symptoms.  A neurological exam. This involves checking central nervous system function, such as nerve function, reflexes, and coordination.  MRIs of the brain and spinal cord.  Lab tests, including a lumbar puncture that tests the fluid that surrounds the brain and spinal cord (cerebrospinal fluid).  Tests to measure the electrical activity of the brain in response to stimulation (evoked potentials). How is this treated? There is no cure for MS, but medicines can help decrease the number and frequency of attacks and help relieve nuisance symptoms. Treatment options may include:  Medicines that reduce the frequency of attacks. These medicines may be given by injection, by mouth (orally), or through an IV.  Medicines that reduce inflammation (steroids). These may  provide short-term relief of symptoms.  Medicines to help control pain, depression, fatigue, or incontinence.  Vitamin D, if you have a deficiency.  Using devices to help you move around (assistive devices), such as braces, a cane, or a  walker.  Physical therapy to strengthen and stretch your muscles.  Occupational therapy to help you with everyday tasks.  Alternative or complementary treatments such as exercise, massage, or acupuncture. Follow these instructions at home:  Take over-the-counter and prescription medicines only as told by your health care provider.  Do not drive or use heavy machinery while taking prescription pain medicine.  Use assistive devices as recommended by your physical therapist or your health care provider.  Exercise as directed by your health care provider.  Return to your normal activities as told by your health care provider. Ask your health care provider what activities are safe for you.  Reach out for support. Share your feelings with friends, family, or a support group.  Keep all follow-up visits as told by your health care provider and therapists. This is important. Where to find more information  National Multiple Sclerosis Society: https://www.nationalmssociety.org Contact a health care provider if:  You feel depressed.  You develop new pain or numbness.  You have tremors.  You have problems with sexual function. Get help right away if:  You develop paralysis.  You develop numbness.  You have problems with your bladder or bowel function.  You develop double vision.  You lose vision in one or both eyes.  You develop suicidal thoughts.  You develop severe confusion. If you ever feel like you may hurt yourself or others, or have thoughts about taking your own life, get help right away. You can go to your nearest emergency department or call:  Your local emergency services (911 in the [place]  A suicide crisis helpline, such as the [place] at (786)283-7749. This is open 24 hours a day. Summary  Multiple sclerosis (MS) is a disease of the central nervous system that causes the body's immune system to destroy the protective covering  (myelin sheath) around nerves in the brain.  There are 3 types of MS: relapsing-remitting, secondary progressive, and primary progressive. Relapsing-remitting and secondary progressive MS cause symptoms to occur in episodes or attacks that may last weeks to months. Primary progressive MS causes symptoms to steadily progress after they develop.  There is no cure for MS, but medicines can help decrease the number and frequency of attacks and help relieve nuisance symptoms. Treatment may also include physical or occupational therapy.  If you develop numbness, paralysis, vision problems, or other neurological symptoms, get help right away. This information is not intended to replace advice given to you by your health care provider. Make sure you discuss any questions you have with your health care provider. Document Released: 04/20/2000 Document Revised: 04/05/2017 Document Reviewed: 07/02/2016 Elsevier Patient Education  2020 [employer].

## 2019-01-01 NOTE — Progress Notes (Signed)
I have read the note, and I agree with the clinical assessment and plan.  Calden Dorsey K Madelon Welsch   

## 2019-01-02 LAB — CBC WITH DIFFERENTIAL/PLATELET
Basophils Absolute: 0 10*3/uL (ref 0.0–0.2)
Basos: 1 %
EOS (ABSOLUTE): 0.1 10*3/uL (ref 0.0–0.4)
Eos: 3 %
Hematocrit: 41.4 % (ref 34.0–46.6)
Hemoglobin: 13.5 g/dL (ref 11.1–15.9)
Immature Grans (Abs): 0 10*3/uL (ref 0.0–0.1)
Immature Granulocytes: 0 %
Lymphocytes Absolute: 0.3 10*3/uL — ABNORMAL LOW (ref 0.7–3.1)
Lymphs: 8 %
MCH: 29.6 pg (ref 26.6–33.0)
MCHC: 32.6 g/dL (ref 31.5–35.7)
MCV: 91 fL (ref 79–97)
Monocytes Absolute: 0.5 10*3/uL (ref 0.1–0.9)
Monocytes: 15 %
Neutrophils Absolute: 2.4 10*3/uL (ref 1.4–7.0)
Neutrophils: 73 %
Platelets: 280 10*3/uL (ref 150–450)
RBC: 4.56 x10E6/uL (ref 3.77–5.28)
RDW: 13.4 % (ref 11.7–15.4)
WBC: 3.3 10*3/uL — ABNORMAL LOW (ref 3.4–10.8)

## 2019-01-02 LAB — COMPREHENSIVE METABOLIC PANEL
ALT: 24 IU/L (ref 0–32)
AST: 20 IU/L (ref 0–40)
Albumin/Globulin Ratio: 2 (ref 1.2–2.2)
Albumin: 4.1 g/dL (ref 3.8–4.8)
Alkaline Phosphatase: 72 IU/L (ref 39–117)
BUN/Creatinine Ratio: 16 (ref 12–28)
BUN: 13 mg/dL (ref 8–27)
Bilirubin Total: 0.3 mg/dL (ref 0.0–1.2)
CO2: 26 mmol/L (ref 20–29)
Calcium: 9.5 mg/dL (ref 8.7–10.3)
Chloride: 103 mmol/L (ref 96–106)
Creatinine, Ser: 0.83 mg/dL (ref 0.57–1.00)
GFR calc Af Amer: 87 mL/min/{1.73_m2} (ref >59)
GFR calc non Af Amer: 76 mL/min/{1.73_m2} (ref >59)
Globulin, Total: 2.1 g/dL (ref 1.5–4.5)
Glucose: 99 mg/dL (ref 65–99)
Potassium: 4.4 mmol/L (ref 3.5–5.2)
Sodium: 141 mmol/L (ref 134–144)
Total Protein: 6.2 g/dL (ref 6.0–8.5)

## 2019-01-05 ENCOUNTER — Telehealth: Payer: Self-pay | Admitting: *Deleted

## 2019-01-05 NOTE — Telephone Encounter (Signed)
I calledpt and  let her know that her labs look ok. Her white blood cell count has dropped as well as her lymphocyte count (part of the white cell). This is something we see with her MS medications and should be monitored very closely. Remind her of infection precautions with need for frequent hand washing, avoidance of crowds, avoiding ill contacts, etc. We will recheck at her follow up in 6 months. She verbalized understanding.

## 2019-01-05 NOTE — Telephone Encounter (Signed)
-----   Message from Debbora Presto, NP sent at 01/05/2019 11:25 AM EDT ----- Please let her know that her labs look ok. Her white blood cell count has dropped as well as her lymphocyte count (part of the white cell). This is something we see with her MS medications and should be monitored very closely. Remind her of infection precautions with need for frequent hand washing, avoidance of crowds, avoiding ill contacts, etc. We will recheck at her follow up in 6 months.

## 2019-01-06 ENCOUNTER — Telehealth: Payer: Self-pay | Admitting: Family Medicine

## 2019-01-06 ENCOUNTER — Telehealth: Payer: Self-pay

## 2019-01-06 NOTE — Telephone Encounter (Signed)
Received a fax from [employer] stating pt has been approved for patient assistance from [place] until 05/07/2019.

## 2019-01-06 NOTE — Telephone Encounter (Signed)
Patient sent to [place] via email. DW

## 2019-01-06 NOTE — Telephone Encounter (Signed)
I called the patient to verify where she would like to have MRI, she has previously gone to GI. She did not answer so I went ahead and proceeded with authorization. I called her insurance and spoke with Joy A who stated [employer]. Ref#[id number] Joy A. DW

## 2019-01-07 ENCOUNTER — Other Ambulatory Visit: Payer: Self-pay

## 2019-01-07 ENCOUNTER — Ambulatory Visit: Payer: Commercial Managed Care - PPO

## 2019-01-07 DIAGNOSIS — G35 Multiple sclerosis: Secondary | ICD-10-CM

## 2019-01-07 MED ORDER — GADOBENATE DIMEGLUMINE 529 MG/ML IV SOLN
20.0000 mL | Freq: Once | INTRAVENOUS | Status: AC | PRN
  Administered 2019-01-07: 20 mL via INTRAVENOUS

## 2019-01-08 ENCOUNTER — Telehealth: Payer: Self-pay | Admitting: *Deleted

## 2019-01-08 NOTE — Telephone Encounter (Signed)
Reviewed

## 2019-01-08 NOTE — Telephone Encounter (Signed)
Received report for pt from [place]. Placed in inbox.

## 2019-01-14 ENCOUNTER — Telehealth: Payer: Self-pay | Admitting: *Deleted

## 2019-01-14 ENCOUNTER — Other Ambulatory Visit: Payer: Self-pay

## 2019-01-14 MED ORDER — GILENYA 0.5 MG PO CAPS: 0.5000 mg | ORAL_CAPSULE | Freq: Every day | ORAL | 11 refills | Status: DC

## 2019-01-14 NOTE — Telephone Encounter (Signed)
-----   Message from Thomes Cake, [place] sent at 01/13/2019  3:23 PM EDT -----  ----- Message ----- From: Debbora Presto, NP Sent: 01/13/2019   7:57 AM EDT To: Thomes Cake, CMA  Please let her know that her MRI was stable from previous reading. No new MS lesions noted.

## 2019-01-15 ENCOUNTER — Telehealth: Payer: Self-pay

## 2019-01-15 NOTE — Telephone Encounter (Signed)
Rx for Gilenya 0.5 mg was faxed to [employer] pt assistance program. Fax # [phone].

## 2019-02-12 ENCOUNTER — Other Ambulatory Visit: Payer: Self-pay

## 2019-02-12 DIAGNOSIS — Z20822 Contact with and (suspected) exposure to covid-19: Secondary | ICD-10-CM

## 2019-02-13 LAB — NOVEL CORONAVIRUS, NAA: SARS-CoV-2, NAA: DETECTED — AB

## 2019-02-20 ENCOUNTER — Other Ambulatory Visit: Payer: Self-pay

## 2019-02-20 DIAGNOSIS — Z20822 Contact with and (suspected) exposure to covid-19: Secondary | ICD-10-CM

## 2019-02-22 LAB — NOVEL CORONAVIRUS, NAA: SARS-CoV-2, NAA: DETECTED — AB

## 2019-04-21 ENCOUNTER — Other Ambulatory Visit: Payer: Self-pay | Admitting: Neurology

## 2019-04-21 ENCOUNTER — Telehealth: Payer: Self-pay

## 2019-04-21 NOTE — Telephone Encounter (Signed)
Pt returned my call and advised she would need to re-enroll in the [employer] pt assistance program. Pt requested I send the pt information to [email or web address]. I have submitted and once pt completes she will fax back to our office.

## 2019-04-21 NOTE — Telephone Encounter (Signed)
Received a fax from [employer] stating the pt's enrollment is set to expire on 05/07/2019 for her Gilenya. I reached out to the pt and advised of this. She reports she is confused as to why we received this message due to an e-mail she had received this week stating she had been successfully enrolled in the program with gilenya go and [employer].  Pt was advised to call me back at # [phone] and update on the information she receives. The form I received has a section where the pt would have to sign.

## 2019-04-21 NOTE — Telephone Encounter (Signed)
Pt returned call and stated she hasnt received email to : [email or web address]

## 2019-04-22 NOTE — Telephone Encounter (Signed)
I have re-sent the e-mail this morning.

## 2019-04-23 NOTE — Telephone Encounter (Signed)
I reached out to the pt. She reports she has received the e-mail with the paper work included.  She sts she will fwd back once she has her tax return. I advised I had a copy of her insurance card and she would not need to send an extra. Pt verbalized understanding.

## 2019-04-23 NOTE — Telephone Encounter (Signed)
Pt is asking for a call from RN to discuss what paperwork she is and what paperwork is not needed to be sent in.  Please call

## 2019-07-08 ENCOUNTER — Encounter: Payer: Self-pay | Admitting: Family Medicine

## 2019-07-09 ENCOUNTER — Telehealth: Payer: Self-pay | Admitting: Neurology

## 2019-07-09 NOTE — Telephone Encounter (Signed)
This patient was seen for ophthalmologic evaluation on 12 May 2019.  Patient is on Gilenya, she has a history of diabetes.  No signs of macular edema were noted.

## 2019-08-06 ENCOUNTER — Telehealth: Payer: Self-pay

## 2019-08-06 NOTE — Telephone Encounter (Signed)
Receive fax from [employer] Patient assistance fondation. PTs eligible to receive GIlenya until 05/06/2020 as long as all program eligibility criteria is met.Contact number is [phone].

## 2019-09-10 ENCOUNTER — Other Ambulatory Visit: Payer: Self-pay | Admitting: Neurology

## 2019-09-30 ENCOUNTER — Telehealth: Payer: Self-pay | Admitting: Family Medicine

## 2019-09-30 NOTE — Telephone Encounter (Signed)
[name] from [facility] called wanting to give the Cover My Meds Key. [id number] Christina Carey states that they are out of network but when the approval comes through they will forward the approval to [facility] which is who it needs to go through

## 2019-09-30 NOTE — Telephone Encounter (Signed)
I called and spoke to [facility] with [facility] pharmacy.  The medication they are asking a PA for is TYMLOS . We do not prescribe for this and I do not see on our med list.  They also have another MD Dr. Raliegh Ip. Carlena Bjornstad they will contact about this medication.

## 2019-10-19 ENCOUNTER — Telehealth: Payer: Self-pay

## 2019-10-19 NOTE — Telephone Encounter (Signed)
Spoke to pt she states she has not received a call back from [facility] and has not received gilenya Have pt contact number and she will let us know the med status

## 2019-10-29 ENCOUNTER — Encounter: Payer: Self-pay | Admitting: Family Medicine

## 2020-03-29 ENCOUNTER — Telehealth: Payer: Self-pay | Admitting: *Deleted

## 2020-03-29 NOTE — Telephone Encounter (Signed)
Patient is needing a Form for patient assistance Gilenya mailed to her . Thanks Hinton Dyer

## 2020-03-29 NOTE — Addendum Note (Signed)
Addended by: Brandon Melnick on: 03/29/2020 03:28 PM   Modules accepted: Orders

## 2020-03-29 NOTE — Telephone Encounter (Signed)
[employer] PAP form completed/ signed and placed in mail  to pt for her to do her part then send in to [employer].

## 2020-03-29 NOTE — Telephone Encounter (Signed)
Completed our prescriber part.  Need provider signature.

## 2020-03-29 NOTE — Telephone Encounter (Signed)
Placed in mail for pts to do her part.

## 2020-03-29 NOTE — Telephone Encounter (Signed)
Signed.

## 2020-05-12 ENCOUNTER — Telehealth: Payer: Self-pay | Admitting: Neurology

## 2020-05-12 NOTE — Telephone Encounter (Signed)
The patient had an ophthalmologic evaluation done on 12 May 2020.  Patient is on Gilenya.  Macula appeared to be normal bilaterally.

## 2020-07-18 ENCOUNTER — Telehealth: Payer: Self-pay | Admitting: Family Medicine

## 2020-07-18 NOTE — Telephone Encounter (Signed)
Pt request refill GILENYA 0.5 MG CAPS at [facility].  Have scheduled f/u 10/19/20.

## 2020-07-19 MED ORDER — GILENYA 0.5 MG PO CAPS: 1.0000 | ORAL_CAPSULE | Freq: Every day | ORAL | 1 refills | Status: DC

## 2020-07-19 NOTE — Telephone Encounter (Addendum)
[facility]ed [facility] re: pts gilenya.  Ok to [place] [employer].  Redid prescription for pt has appt 10-20-20, after speaking to pt and she relayed has been taking the medication with no gap in treatment.

## 2020-08-08 ENCOUNTER — Telehealth: Payer: Self-pay | Admitting: *Deleted

## 2020-08-08 NOTE — Telephone Encounter (Signed)
Received fax from [employer]: patient is not eligible to receive PAP from [employer]. Questions: [phone].

## 2020-10-19 ENCOUNTER — Ambulatory Visit: Payer: Commercial Managed Care - PPO | Admitting: Family Medicine

## 2020-10-20 ENCOUNTER — Encounter: Payer: Self-pay | Admitting: Adult Health

## 2020-10-20 ENCOUNTER — Ambulatory Visit: Payer: Commercial Managed Care - PPO | Admitting: Adult Health

## 2020-10-20 VITALS — BP 123/55 | HR 79 | Ht 67.0 in | Wt 212.0 lb

## 2020-10-20 DIAGNOSIS — G35 Multiple sclerosis: Secondary | ICD-10-CM

## 2020-10-20 NOTE — Progress Notes (Signed)
I have read the note, and I agree with the clinical assessment and plan.  Tyshay Adee K Tamara Kenyon   

## 2020-10-20 NOTE — Progress Notes (Signed)
PATIENT: Christina Carey DOB: 1955-08-17  REASON FOR VISIT: follow up HISTORY FROM: patient PRIMARY NEUROLOGIST: Dr. Jannifer Franklin  HISTORY OF PRESENT ILLNESS: Today 10/20/20:  [name] is a 65 year old female with a history of multiple sclerosis.  She returns today for follow-up.  She is currently on Gilenya.  Tolerates it well.  Denies any new symptoms.  No changes with her gait or balance. no changes with the bowels or bladder.  No changes with her vision.  No new numbness or weakness.  Overall she feels that she is doing well.  She returns today for an evaluation.  HISTORY 01/01/19 [name] is a 65 y.o. female here today for follow up of MS. She was started on Gilenya at last follow up in 01/2018 due to possible correlation of ischemic colitis on Avonex. She reports doing very well on Gilenya. She denies vision changes, changes in bowel or bladder habits or changes in gait. She is tolerating medication well. She reports feeling well and without complaints today. Last MRI 2018. Eye exam on 08/19/2018 and was reportedly normal.     HISTORY: (copied from [facility] note on 01/13/2018)    UPDATE 01/13/2018 CM [name] 65 year old female returns for follow-up with a history of relapsing remitting multiple sclerosis.  She is currently on Avonex denies injection site issues she gets her medications through patient assistance.  Unfortunately she has had 2 episodes of ischemic colitis 05/2015 and 1 more recently.   Dr.Armbruster, her GI doctor would like her to get off interferons since he feels there may be a correlation.  I discussed different oral preparations with the patient.  She decided on tecfidera.  She was made aware that it can cause some stomach issues and flushing however she wants to try that first and then if she fails that medication she will try Gilenya.  She returns for reevaluation she has had no exacerbation of her symptoms.  Most recent MRI September 2018 without change  from February 2016.  No other interval medical issues.  She continues to work full-time   UPDATE 3/7/2019CM [name], 65 year old female returns for follow-up with history of relapsing remitting multiple sclerosis.  She is currently on Avonex pen and denies injection site issues.  She gets her medications through patient assistance.  She denies any speech or swallowing problems double vision, weakness, spasticity, sensory changes, or bowel or bladder difficulty.  She denies any falls.  Most recent MRI 01/21/2017 Abnormal MRI brain (with and without) demonstrating: 1. Few periventricular and subcortical and juxtacortical chronic demyelinating plaques. 2. No abnormal lesions are seen on post contrast views.   3. No change from MRI on 06/28/14. She returns for reevaluation and lab.  Patient states she has been doing well   UPDATE 01/10/17 CM [name], 65 year old female returns for follow-up with history of multiple sclerosis. She is currently on Avonex and  denies injection site problems. She denies double vision weakness speech or swallowing difficulties she has not had any falls. Last MRI of the brain 06/28/2014 was abnormal with and without contrast showing foci within the white matter in a pattern and configuration consistent with the clinical diagnosis of multiple sclerosis no acute lesions. She does complain of clumsiness all her life .She complains of  balance issues, but as noted above no falls. She returns today for reevaluation and she will need labs to monitor liver function and side effects to Avonex. She has tried both tecfidera and Betaseron in the past  for her MS. She needs repeat MRI of the brain to follow-up progression of her disease. She returns for reevaluation   HISTORY: She has a history of multiple sclerosis. She was on Betaseron but was tired of shots at her last visit and was switched to tecfidera. When she eats a full meal with the medication she has much less flushing and  abdominal symptoms however if she eats a light meal her flushing is worse. Most recent MRI 05/29/2012 with previous MS lesions not seen. She continues to have numbness which is intermittent on the top of the foot and she also has a pressure area over the third toe on the right with significant corns on the bottom of both feet. She denies any other sensory changes, focal weakness, speech or swallowing difficulty, double vision, blurred vision, balance issues    REVIEW OF SYSTEMS: Out of a complete 14 system review of symptoms, the patient complains only of the following symptoms, and all other reviewed systems are negative.  ALLERGIES: Allergies  Allergen Reactions   Sulfamethoxazole-Trimethoprim     REACTION: rash, taking trimethoprim now and is not having a problem. 01-05-15.    HOME MEDICATIONS: Outpatient Medications Prior to Visit  Medication Sig Dispense Refill   Calcium Carbonate-Vitamin D (CALCIUM + D PO) Take 1,000 mg by mouth daily.      CINNAMON PO Take 1,000 mg by mouth 2 (two) times daily.     ESTRACE VAGINAL 0.1 MG/GM vaginal cream 1 Applicatorful as needed.   3   fexofenadine (ALLEGRA) 180 MG tablet Take 180 mg by mouth daily as needed.   0   Fingolimod HCl (GILENYA) 0.5 MG CAPS Take 1 capsule (0.5 mg total) by mouth daily. 90 capsule 1   levothyroxine (SYNTHROID, LEVOTHROID) 88 MCG tablet Take 88 mcg by mouth daily.  12   nystatin-triamcinolone (MYCOLOG II) cream Apply 1 application topically daily as needed (irration).   1   valsartan-hydrochlorothiazide (DIOVAN-HCT) 160-12.5 MG tablet 1 tablet     simvastatin (ZOCOR) 5 MG tablet Take 5 mg by mouth every evening.      trandolapril (MAVIK) 4 MG tablet Take 4 mg by mouth daily.  1   No facility-administered medications prior to visit.    PAST MEDICAL HISTORY: Past Medical History:  Diagnosis Date   CKD stage 2 due to type 2 diabetes mellitus ([place])    Colitis 06/2015   admitted for suspected ischemic colitis   DM2  (diabetes mellitus, type 2) ([facility])    GERD (gastroesophageal reflux disease)    High blood pressure    Hyperlipemia    Hypertension, renal 2016   Christina Pretty   MS (multiple sclerosis) [facility])    Thyroid disorder     PAST SURGICAL HISTORY: Past Surgical History:  Procedure Laterality Date   COLONOSCOPY     WISDOM TOOTH EXTRACTION      FAMILY HISTORY: Family History  Problem Relation Age of Onset   Ovarian cancer Mother    Lung cancer Father    Cancer Maternal Grandmother    Colon cancer Neg Hx     SOCIAL HISTORY: Social History   Socioeconomic History   Marital status: Married    Spouse name: Lawrence   Number of children: 2   Years of education: 16   Highest education level: Not on file  Occupational History   Occupation: ACCOUNTING    Employer: [employer]  Tobacco Use   Smoking status: Never   Smokeless tobacco: Never  Substance and  Sexual Activity   Alcohol use: No    Comment: caffeine drinks 1-2 daily   Drug use: No   Sexual activity: Not on file  Other Topics Concern   Not on file  Social History Narrative   Patient lives at home with her husband Farrel Gordon. and they have 2 adult children. 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    Patient is right- handed.   Patient drinks one cup of coffee daily and soda very rarely.   Social Determinants of Health   Financial Resource Strain: Not on file  Food Insecurity: Not on file  Transportation Needs: Not on file  Physical Activity: Not on file  Stress: Not on file  Social Connections: Not on file  Intimate Partner Violence: Not on file      PHYSICAL EXAM  Vitals:   10/20/20 0753  BP: (!) 123/55  Pulse: 79  Weight: 212 lb (96.2 kg)  Height: 5\' 7"  (1.702 m)   Body mass index is 33.2 kg/m.  Generalized: Well developed, in no acute distress   Neurological examination  Mentation: Alert oriented to time, place, history taking. Follows all commands speech and language  fluent Cranial nerve II-XII: Pupils were equal round reactive to light. Extraocular movements were full, visual field were full on confrontational test. Facial sensation and strength were normal. Uvula tongue midline. Head turning and shoulder shrug  were normal and symmetric. Motor: The motor testing reveals 5 over 5 strength of all 4 extremities. Good symmetric motor tone is noted throughout.  Sensory: Sensory testing is intact to soft touch on all 4 extremities. No evidence of extinction is noted.  Coordination: Cerebellar testing reveals good finger-nose-finger and heel-to-shin bilaterally.  Gait and station: Gait is normal. Tandem gait is normal. Romberg is negative. No drift is seen.  Reflexes: Deep tendon reflexes are symmetric and normal bilaterally.   DIAGNOSTIC DATA (LABS, IMAGING, TESTING) - I reviewed patient records, labs, notes, testing and imaging myself where available.  Lab Results  Component Value Date   WBC 3.3 (L) 01/01/2019   HGB 13.5 01/01/2019   HCT 41.4 01/01/2019   MCV 91 01/01/2019   PLT 280 01/01/2019      Component Value Date/Time   NA 141 01/01/2019 0833   K 4.4 01/01/2019 0833   CL 103 01/01/2019 0833   CO2 26 01/01/2019 0833   GLUCOSE 99 01/01/2019 0833   GLUCOSE 97 06/26/2015 1538   BUN 13 01/01/2019 0833   CREATININE 0.83 01/01/2019 0833   CALCIUM 9.5 01/01/2019 0833   PROT 6.2 01/01/2019 0833   ALBUMIN 4.1 01/01/2019 0833   AST 20 01/01/2019 0833   ALT 24 01/01/2019 0833   ALKPHOS 72 01/01/2019 0833   BILITOT 0.3 01/01/2019 0833   GFRNONAA 76 01/01/2019 0833   GFRAA 87 01/01/2019 0833       ASSESSMENT AND PLAN 65 y.o. year old female  has a past medical history of CKD stage 2 due to type 2 diabetes mellitus ([place]), Colitis (06/2015), DM2 (diabetes mellitus, type 2) ([place]), GERD (gastroesophageal reflux disease), High blood pressure, Hyperlipemia, Hypertension, renal (2016), MS (multiple sclerosis) ([place]), and Thyroid disorder. here  with:  1.  Multiple sclerosis  Continue Gilenya Blood work today We will repeat MRI of the brain at the next visit Follow-up in 6 months or sooner if needed     Ward Givens, MSN, NP-C 10/20/2020, 8:17 AM [facility] [address] [place], [place] [address] [phone]  [phone]   

## 2020-10-20 NOTE — Patient Instructions (Addendum)
Your Plan:  Continue gilenya  Blood work  If your symptoms worsen or you develop new symptoms please let us know.    Thank you for coming to see [place] at [facility]. I hope we have been able to provide you high quality care today.  You may receive a patient satisfaction survey over the next few weeks. We would appreciate your feedback and comments so that we may continue to improve ourselves and the health of our patients.

## 2020-10-21 LAB — CBC WITH DIFFERENTIAL/PLATELET
Basophils Absolute: 0 10*3/uL (ref 0.0–0.2)
Basos: 1 %
EOS (ABSOLUTE): 0.1 10*3/uL (ref 0.0–0.4)
Eos: 3 %
Hematocrit: 41 % (ref 34.0–46.6)
Hemoglobin: 13.8 g/dL (ref 11.1–15.9)
Immature Grans (Abs): 0 10*3/uL (ref 0.0–0.1)
Immature Granulocytes: 1 %
Lymphocytes Absolute: 0.3 10*3/uL — ABNORMAL LOW (ref 0.7–3.1)
Lymphs: 11 %
MCH: 30 pg (ref 26.6–33.0)
MCHC: 33.7 g/dL (ref 31.5–35.7)
MCV: 89 fL (ref 79–97)
Monocytes Absolute: 0.5 10*3/uL (ref 0.1–0.9)
Monocytes: 15 %
Neutrophils Absolute: 2.3 10*3/uL (ref 1.4–7.0)
Neutrophils: 69 %
Platelets: 262 10*3/uL (ref 150–450)
RBC: 4.6 x10E6/uL (ref 3.77–5.28)
RDW: 13 % (ref 11.7–15.4)
WBC: 3.2 10*3/uL — ABNORMAL LOW (ref 3.4–10.8)

## 2020-10-21 LAB — COMPREHENSIVE METABOLIC PANEL
ALT: 23 IU/L (ref 0–32)
AST: 22 IU/L (ref 0–40)
Albumin/Globulin Ratio: 1.8 (ref 1.2–2.2)
Albumin: 4.1 g/dL (ref 3.8–4.8)
Alkaline Phosphatase: 79 IU/L (ref 44–121)
BUN/Creatinine Ratio: 15 (ref 12–28)
BUN: 13 mg/dL (ref 8–27)
Bilirubin Total: 0.2 mg/dL (ref 0.0–1.2)
CO2: 25 mmol/L (ref 20–29)
Calcium: 9.6 mg/dL (ref 8.7–10.3)
Chloride: 104 mmol/L (ref 96–106)
Creatinine, Ser: 0.86 mg/dL (ref 0.57–1.00)
Globulin, Total: 2.3 g/dL (ref 1.5–4.5)
Glucose: 114 mg/dL — ABNORMAL HIGH (ref 65–99)
Potassium: 3.8 mmol/L (ref 3.5–5.2)
Sodium: 142 mmol/L (ref 134–144)
Total Protein: 6.4 g/dL (ref 6.0–8.5)
eGFR: 75 mL/min/{1.73_m2} (ref >59)

## 2020-11-29 ENCOUNTER — Other Ambulatory Visit: Payer: Self-pay

## 2020-11-29 MED ORDER — GILENYA 0.5 MG PO CAPS: 1.0000 | ORAL_CAPSULE | Freq: Every day | ORAL | 3 refills | Status: DC

## 2020-12-13 ENCOUNTER — Telehealth: Payer: Self-pay | Admitting: Adult Health

## 2020-12-13 DIAGNOSIS — G35 Multiple sclerosis: Secondary | ICD-10-CM

## 2020-12-13 NOTE — Telephone Encounter (Signed)
Mitzi Hansen from [employer] called stating that they are needing a letter of hardship for the pt regarding her Fingolimod HCl (GILENYA) 0.5 MG CAPS Please advise.

## 2020-12-14 NOTE — Telephone Encounter (Signed)
Called [employer] spoke to [place], relating to request for letter of hardship for pt on OOP cost for gilenya PAP.  I called pt and she has faxed letter to them, I reiterated that they have not received so would refax to them again.  I gave her fax [phone], and phone # [phone].  She appreciated call back.

## 2020-12-21 NOTE — Telephone Encounter (Signed)
Received fax form [employer]: patient has been approved for PAP. She is eligible to receive Gilenya at no cost until 05/06/2021.  ID # [id number]. Questions: [phone]. Called patient and advised of approval. She has had PAP through [employer] in past, is familiar with ordering refills. Patient verbalized understanding, appreciation.

## 2020-12-22 ENCOUNTER — Encounter: Payer: Self-pay | Admitting: *Deleted

## 2020-12-22 MED ORDER — GILENYA 0.5 MG PO CAPS: 1.0000 | ORAL_CAPSULE | Freq: Every day | ORAL | 3 refills | Status: DC

## 2020-12-22 NOTE — Telephone Encounter (Signed)
Pt called stating [employer] has not received a prescription for her Gilenya. Pt requesting a call back.

## 2020-12-22 NOTE — Telephone Encounter (Signed)
Called [employer] PAP, spoke with Eddie Dibbles who stated to e scribe Rx to [employer]. New Rx sent as Rx sent on 11/29/20 was sent to [facility] which is incorrect pharmacy. Marland Kitchen

## 2020-12-22 NOTE — Addendum Note (Signed)
Addended by: Florian Buff C on: 12/22/2020 11:57 AM   Modules accepted: Orders

## 2021-03-15 ENCOUNTER — Other Ambulatory Visit: Payer: Self-pay | Admitting: *Deleted

## 2021-03-15 DIAGNOSIS — G35 Multiple sclerosis: Secondary | ICD-10-CM

## 2021-03-15 MED ORDER — FINGOLIMOD HCL 0.5 MG PO CAPS: 1.0000 | ORAL_CAPSULE | Freq: Every day | ORAL | 3 refills | Status: DC

## 2021-03-16 ENCOUNTER — Telehealth: Payer: Self-pay | Admitting: *Deleted

## 2021-03-16 NOTE — Telephone Encounter (Signed)
Patient assistance for Gilenya, prescriber portion, and insurance info faxed to [employer]. Received a receipt of confirmation.

## 2021-04-24 NOTE — Progress Notes (Addendum)
PATIENT: Christina Carey DOB: 04-23-1956  REASON FOR VISIT: follow up HISTORY FROM: patient  Chief Complaint  Patient presents with   Follow-up    Rm 1, alone. Here for 6 month MS f/u. On Gilenya and tolerating well. Pt is stable and has no concerns today.      HISTORY OF PRESENT ILLNESS: 04/26/21 ALL: Christina Carey returns for follow up for RRMS. She continues Gilenya. MRI stable 01/2019. Labs were stable 10/2020. She had repeat CBC with PCP in 12/2020 and lymphocyte count 0.4. Liver enzymes normal.   She continues to do very well. No new or exacerbating symptoms. She denies difficulty with gait. She does have occasional imbalance if in wide open spaces. She feels that it is more that she is nervous if there is not something close to hold on to if needed. This is rarely an issue. No assistive devices or falls.   She sleeps well. Mood is good. She does have mild urinary frequency and urge incontinence but feels it is better on Estrace cream.   01/01/2019 ALL: Christina Carey is a 65 y.o. female here today for follow up of MS. She was started on Gilenya at last follow up in 01/2018 due to possible correlation of ischemic colitis on Avonex. She reports doing very well on Gilenya. She denies vision changes, changes in bowel or bladder habits or changes in gait. She is tolerating medication well. She reports feeling well and without complaints today. Last MRI 2018. Eye exam on 08/19/2018 and was reportedly normal.    HISTORY: (copied from [facility] note on 01/13/2018)   UPDATE 01/13/2018 [name] 65 year old female returns for follow-up with a history of relapsing remitting multiple sclerosis.  She is currently on Avonex denies injection site issues she gets her medications through patient assistance.  Unfortunately she has had 2 episodes of ischemic colitis 05/2015 and 1 more recently.   Dr.Armbruster, her GI doctor would like her to get off interferons since he feels there may be a  correlation.  I discussed different oral preparations with the patient.  She decided on tecfidera.  She was made aware that it can cause some stomach issues and flushing however she wants to try that first and then if she fails that medication she will try Gilenya.  She returns for reevaluation she has had no exacerbation of her symptoms.  Most recent MRI September 2018 without change from February 2016.  No other interval medical issues.  She continues to work full-time   UPDATE 3/7/2019[name], 65 year old female returns for follow-up with history of relapsing remitting multiple sclerosis.  She is currently on Avonex pen and denies injection site issues.  She gets her medications through patient assistance.  She denies any speech or swallowing problems double vision, weakness, spasticity, sensory changes, or bowel or bladder difficulty.  She denies any falls.  Most recent MRI 01/21/2017 Abnormal MRI brain (with and without) demonstrating: 1. Few periventricular and subcortical and juxtacortical chronic demyelinating plaques. 2. No abnormal lesions are seen on post contrast views.   3. No change from MRI on 06/28/14.  She returns for reevaluation and lab.  Patient states she has been doing well   UPDATE 01/10/17 [name], 64 year old female returns for follow-up with history of multiple sclerosis. She is currently on Avonex and  denies injection site problems. She denies double vision weakness speech or swallowing difficulties she has not had any falls. Last MRI of the brain 06/28/2014 was abnormal  with and without contrast showing foci within the white matter in a pattern and configuration consistent with the clinical diagnosis of multiple sclerosis no acute lesions. She does complain of clumsiness all her life .She complains of  balance issues, but as noted above no falls. She returns today for reevaluation and she will need labs to monitor liver function and side effects to Avonex. She has  tried both tecfidera and Betaseron in the past for her MS. She needs repeat MRI of the brain to follow-up progression of her disease. She returns for reevaluation   HISTORY: She has a history of multiple sclerosis. She was on Betaseron but was tired of shots at her last visit and was switched to tecfidera. When she eats a full meal with the medication she has much less flushing and abdominal symptoms however if she eats a light meal her flushing is worse. Most recent MRI 05/29/2012 with previous MS lesions not seen. She continues to have numbness which is intermittent on the top of the foot and she also has a pressure area over the third toe on the right with significant corns on the bottom of both feet. She denies any other sensory changes, focal weakness, speech or swallowing difficulty, double vision, blurred vision, balance issues   REVIEW OF SYSTEMS: Out of a complete 14 system review of symptoms, the patient complains only of the following symptoms, urinary frequency, urge incontinence and all other reviewed systems are negative.  ALLERGIES: Allergies  Allergen Reactions   Sulfamethoxazole-Trimethoprim     REACTION: rash, taking trimethoprim now and is not having a problem. 01-05-15.    HOME MEDICATIONS: Outpatient Medications Prior to Visit  Medication Sig Dispense Refill   Calcium Carbonate-Vitamin D (CALCIUM + D PO) Take 1,000 mg by mouth daily.      CINNAMON PO Take 1,000 mg by mouth 2 (two) times daily.     ESTRACE VAGINAL 0.1 MG/GM vaginal cream 1 Applicatorful as needed.   3   Fingolimod HCl (GILENYA) 0.5 MG CAPS Take 1 capsule (0.5 mg total) by mouth daily. 90 capsule 3   levothyroxine (SYNTHROID, LEVOTHROID) 88 MCG tablet Take 88 mcg by mouth daily.  12   Methylcellulose, Laxative, (CITRUCEL) 500 MG TABS Take 2 tablets by mouth daily.     valsartan-hydrochlorothiazide (DIOVAN-HCT) 160-12.5 MG tablet 1 tablet     fexofenadine (ALLEGRA) 180 MG tablet Take 180 mg by mouth daily as  needed.   0   nystatin-triamcinolone (MYCOLOG II) cream Apply 1 application topically daily as needed (irration).   1   No facility-administered medications prior to visit.    PAST MEDICAL HISTORY: Past Medical History:  Diagnosis Date   CKD stage 2 due to type 2 diabetes mellitus ([place])    Colitis 06/2015   admitted for suspected ischemic colitis   DM2 (diabetes mellitus, type 2) ([facility])    GERD (gastroesophageal reflux disease)    High blood pressure    Hyperlipemia    Hypertension, renal 2016   [name]   MS (multiple sclerosis) [facility])    Thyroid disorder     PAST SURGICAL HISTORY: Past Surgical History:  Procedure Laterality Date   COLONOSCOPY     WISDOM TOOTH EXTRACTION      FAMILY HISTORY: Family History  Problem Relation Age of Onset   Ovarian cancer Mother    Lung cancer Father    Cancer Maternal Grandmother    Colon cancer Neg Hx     SOCIAL HISTORY: Social History  Socioeconomic History   Marital status: Married    Spouse name: Purcell Nails   Number of children: 2   Years of education: 16   Highest education level: Not on file  Occupational History   Occupation: ACCOUNTING    Employer: [employer]  Tobacco Use   Smoking status: Never   Smokeless tobacco: Never  Substance and Sexual Activity   Alcohol use: No    Comment: caffeine drinks 1-2 daily   Drug use: No   Sexual activity: Not on file  Other Topics Concern   Not on file  Social History Narrative   Patient lives at home with her husband Farrel Gordon. and they have 2 adult children. 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    Patient is right- handed.   Patient drinks one cup of coffee daily and soda very rarely.   Social Determinants of Health   Financial Resource Strain: Not on file  Food Insecurity: Not on file  Transportation Needs: Not on file  Physical Activity: Not on file  Stress: Not on file  Social Connections: Not on file  Intimate Partner Violence:  Not on file      PHYSICAL EXAM  Vitals:   04/26/21 0714  BP: 132/78  Pulse: 73  Weight: 183 lb (83 kg)  Height: 5\' 7"  (1.702 m)    Body mass index is 28.66 kg/m.  Generalized: Well developed, in no acute distress  Cardiology: normal rate and rhythm, no murmur noted Neurological examination  Mentation: Alert oriented to time, place, history taking. Follows all commands speech and language fluent Cranial nerve II-XII: Pupils were equal round reactive to light. Extraocular movements were full, visual field were full on confrontational test. Facial sensation and strength were normal. Head turning and shoulder shrug  were normal and symmetric. Motor: The motor testing reveals 5 over 5 strength of all 4 extremities. Good symmetric motor tone is noted throughout.  Sensory: Sensory testing is intact to soft touch on all 4 extremities. No evidence of extinction is noted.  Coordination: Cerebellar testing reveals good finger-nose-finger and heel-to-shin bilaterally.  Gait and station: Gait is normal.    DIAGNOSTIC DATA (LABS, IMAGING, TESTING) - I reviewed patient records, labs, notes, testing and imaging myself where available.  No flowsheet data found.   Lab Results  Component Value Date   WBC 3.2 (L) 10/20/2020   HGB 13.8 10/20/2020   HCT 41.0 10/20/2020   MCV 89 10/20/2020   PLT 262 10/20/2020      Component Value Date/Time   NA 142 10/20/2020 0833   K 3.8 10/20/2020 0833   CL 104 10/20/2020 0833   CO2 25 10/20/2020 0833   GLUCOSE 114 (H) 10/20/2020 0833   GLUCOSE 97 06/26/2015 1538   BUN 13 10/20/2020 0833   CREATININE 0.86 10/20/2020 0833   CALCIUM 9.6 10/20/2020 0833   PROT 6.4 10/20/2020 0833   ALBUMIN 4.1 10/20/2020 0833   AST 22 10/20/2020 0833   ALT 23 10/20/2020 0833   ALKPHOS 79 10/20/2020 0833   BILITOT 0.2 10/20/2020 0833   GFRNONAA 76 01/01/2019 0833   GFRAA 87 01/01/2019 0833   No results found for: CHOL, HDL, LDLCALC, LDLDIRECT, TRIG, CHOLHDL No  results found for: HGBA1C No results found for: VITAMINB12 No results found for: TSH    ASSESSMENT AND PLAN 65 y.o. year old female  has a past medical history of CKD stage 2 due to type 2 diabetes mellitus ([place]), Colitis (06/2015), DM2 (diabetes  mellitus, type 2) ([place]), GERD (gastroesophageal reflux disease), High blood pressure, Hyperlipemia, Hypertension, renal (2016), MS (multiple sclerosis) ([place]), and Thyroid disorder. here with     ICD-10-CM   1. Multiple sclerosis ([place])  G35        Elizabeth is doing very well on Gilenya.  She denies adverse effects.  No new symptoms of numbness, vision changes, bladder/bowel or gait changes. Labs stable 12/2020. Last MRI 2020 stable. We will repeat for monitoring. She was encouraged to continue regular follow-up with ophthalmology. I will have her establish care with Dr Felecia Shelling in Dr Jannifer Franklin' retirement. She would like to consider stopping DMT if Dr Felecia Shelling feels this is reasonable. She is aware to call with any new or worsening concerns.  She verbalizes understanding and agreement with this plan.   No orders of the defined types were placed in this encounter.    No orders of the defined types were placed in this encounter.    Debbora Presto, FNP-C 04/26/2021, 7:19 AM [facility] [address], [place], [place] [address] [phone]

## 2021-04-24 NOTE — Patient Instructions (Signed)
Below is our plan:  We will continue Gilenya for now. We will update labs. I will get you in with Dr Felecia Shelling for transfer of care in Dr Jannifer Franklin' retirement. We can discuss discontinuing DMT pending Dr Garth Bigness evaluation.   Please make sure you are staying well hydrated. I recommend 50-60 ounces daily. Well balanced diet and regular exercise encouraged. Consistent sleep schedule with 6-8 hours recommended.   Please continue follow up with care team as directed.   Follow up with Dr Felecia Shelling in 4-6 months   You may receive a survey regarding today's visit. I encourage you to leave honest feed back as I do use this information to improve patient care. Thank you for seeing me today!

## 2021-04-26 ENCOUNTER — Ambulatory Visit (INDEPENDENT_AMBULATORY_CARE_PROVIDER_SITE_OTHER): Payer: Commercial Managed Care - PPO | Admitting: Family Medicine

## 2021-04-26 ENCOUNTER — Encounter: Payer: Self-pay | Admitting: Family Medicine

## 2021-04-26 VITALS — BP 132/78 | HR 73 | Ht 67.0 in | Wt 183.0 lb

## 2021-04-26 DIAGNOSIS — G35 Multiple sclerosis: Secondary | ICD-10-CM

## 2021-04-26 NOTE — Addendum Note (Signed)
Addended by: Debbora Presto L on: 04/26/2021 10:37 AM   Modules accepted: Orders

## 2021-05-24 ENCOUNTER — Other Ambulatory Visit: Payer: Self-pay

## 2021-05-24 ENCOUNTER — Encounter: Payer: Self-pay | Admitting: Physician Assistant

## 2021-05-24 ENCOUNTER — Ambulatory Visit: Payer: Commercial Managed Care - PPO | Admitting: Physician Assistant

## 2021-05-24 DIAGNOSIS — L570 Actinic keratosis: Secondary | ICD-10-CM

## 2021-05-24 DIAGNOSIS — D485 Neoplasm of uncertain behavior of skin: Secondary | ICD-10-CM

## 2021-05-24 DIAGNOSIS — Z808 Family history of malignant neoplasm of other organs or systems: Secondary | ICD-10-CM

## 2021-05-24 DIAGNOSIS — C44329 Squamous cell carcinoma of skin of other parts of face: Secondary | ICD-10-CM

## 2021-05-24 NOTE — Patient Instructions (Signed)

## 2021-05-29 ENCOUNTER — Encounter: Payer: Self-pay | Admitting: Physician Assistant

## 2021-05-29 NOTE — Progress Notes (Signed)
° °  New Patient   Subjective  Christina Carey is a 66 y.o. female who presents for the following: Skin Problem (Right side of nose x 8 months- getting bigger. Family history of melanoma but no personal history of melanoma or non mole skin cancers ).   The following portions of the chart were reviewed this encounter and updated as appropriate:  Tobacco   Allergies   Meds   Problems   Med Hx   Surg Hx   Fam Hx       Objective  Well appearing patient in no apparent distress; mood and affect are within normal limits.  A full examination was performed including scalp, head, eyes, ears, nose, lips, neck, chest, axillae, abdomen, back, buttocks, bilateral upper extremities, bilateral lower extremities, hands, feet, fingers, toes, fingernails, and toenails. All findings within normal limits unless otherwise noted below.  Right Malar Cheek Hyperkeratotic scale with pink base        Right Upper Cutaneous Lip (7) Erythematous patches with gritty scale.   Assessment & Plan  Neoplasm of uncertain behavior of skin Right Malar Cheek  Skin / nail biopsy Type of biopsy: tangential   Informed consent: discussed and consent obtained   Timeout: patient name, date of birth, surgical site, and procedure verified   Anesthesia: the lesion was anesthetized in a standard fashion   Anesthetic:  1% lidocaine w/ epinephrine 1-100,000 local infiltration Instrument used: flexible razor blade   Hemostasis achieved with: aluminum chloride and electrodesiccation   Outcome: patient tolerated procedure well   Post-procedure details: wound care instructions given    Specimen 1 - Surgical pathology Differential Diagnosis: R/O BCC VS SCC  Check Margins: No  Actinic keratosis (7) Right Upper Cutaneous Lip  Destruction of lesion - Right Upper Cutaneous Lip Complexity: simple   Destruction method: cryotherapy   Informed consent: discussed and consent obtained   Timeout:  patient name, date of birth,  surgical site, and procedure verified Lesion destroyed using liquid nitrogen: Yes   Cryotherapy cycles:  3 Outcome: patient tolerated procedure well with no complications   Post-procedure details: wound care instructions given     No atypical nevi noted at the time of the visit.  I, Desteni Piscopo, PA-C, have reviewed all documentation's for this visit.  The documentation on 05/29/21 for the exam, diagnosis, procedures and orders are all accurate and complete.

## 2021-05-30 ENCOUNTER — Telehealth: Payer: Self-pay | Admitting: *Deleted

## 2021-05-30 NOTE — Telephone Encounter (Signed)
-----   Message from Warren Danes, [place] sent at 05/29/2021  4:22 PM EST ----- mohs

## 2021-05-30 NOTE — Telephone Encounter (Signed)
Pathology results to patient- referral sent to [facility] for [facility].

## 2021-06-13 ENCOUNTER — Telehealth: Payer: Self-pay | Admitting: Dermatology

## 2021-06-13 NOTE — Telephone Encounter (Signed)
Patient aware mohs is 3 weeks out on calling the patients back and she is still in review.

## 2021-06-13 NOTE — Telephone Encounter (Signed)
Patient left a message on office voice mail that she was calling to check on the status of a referral to [facility]).  Patient states that it has been 2 weeks and she was instructed to call after 2 weeks if she had not heard from [facility].

## 2021-06-26 ENCOUNTER — Encounter: Payer: Self-pay | Admitting: Physician Assistant

## 2021-07-19 ENCOUNTER — Other Ambulatory Visit: Payer: Self-pay

## 2021-07-19 DIAGNOSIS — G35 Multiple sclerosis: Secondary | ICD-10-CM

## 2021-07-19 MED ORDER — FINGOLIMOD HCL 0.5 MG PO CAPS: 1.0000 | ORAL_CAPSULE | Freq: Every day | ORAL | 3 refills | Status: DC

## 2021-07-24 ENCOUNTER — Telehealth: Payer: Self-pay | Admitting: Family Medicine

## 2021-07-24 NOTE — Telephone Encounter (Signed)
PA completed on C[facility] ?[id number]  ?Will await response from insurance ?

## 2021-07-24 NOTE — Telephone Encounter (Signed)
Pt said pharmacy need PA for Fingolimod HCl (GILENYA) 0.5 MG CAPS . Pt will be out medication after Friday. ?

## 2021-07-24 NOTE — Telephone Encounter (Signed)
"  This medication or product is on your plan's list of covered drugs. Prior authorization is not required at this time. If your pharmacy has questions regarding the processing of your prescription, please have them call the [facility] pharmacy help desk at [phone].  ? ?Called the pt and advised this was already covered on her plan.  ?She states that she has been told several times by the pharmacy that it needed an authorization.  ?Advised I would call the pharmacy on her behalf.  ?Called the [facility] and they were getting a message indicating that a PA was needed. I have advised that I completed on CMM and the response I received. She was able to view that.  ?She responded that the pt doesn't have speciality coverage through her insurance. She states [facility] can [facility] and I advised that the med is still considered a specialty medication and [facility] will not [facility] this. She was not able to provide me with any other explanation.  ? ?I called the pt back and advised her of the this information. At this point she is scheduled 3/28 for MRI's here through our practice and then I was able to schedule f/u visit on 3/30 to discuss the MRI's and then decide what next steps are for treatment or whether she will dc treatment if things are stable.  ?Recommended in the meantime she may could contact gilenya program and ask if they will be willing to extend coverage for medication for up to 1-2 week until she has her visit. Pt verbalized understanding. ? ?

## 2021-07-26 ENCOUNTER — Telehealth: Payer: Self-pay | Admitting: Family Medicine

## 2021-07-26 NOTE — Telephone Encounter (Signed)
MR Brain w/wo contrast Dr. Arlyss Queen auth: [place] [employer] Ref # [id number]. Patient is scheduled at [facility] for 08/01/21  ?

## 2021-08-01 ENCOUNTER — Ambulatory Visit (INDEPENDENT_AMBULATORY_CARE_PROVIDER_SITE_OTHER): Payer: Commercial Managed Care - PPO

## 2021-08-01 DIAGNOSIS — G35 Multiple sclerosis: Secondary | ICD-10-CM

## 2021-08-01 MED ORDER — GADOBENATE DIMEGLUMINE 529 MG/ML IV SOLN
15.0000 mL | Freq: Once | INTRAVENOUS | Status: AC | PRN
  Administered 2021-08-01: 15 mL via INTRAVENOUS

## 2021-08-03 ENCOUNTER — Ambulatory Visit: Payer: Commercial Managed Care - PPO | Admitting: Neurology

## 2021-08-03 ENCOUNTER — Encounter: Payer: Self-pay | Admitting: Neurology

## 2021-08-03 VITALS — BP 137/80 | HR 76 | Ht 67.0 in | Wt 174.5 lb

## 2021-08-03 DIAGNOSIS — G35 Multiple sclerosis: Secondary | ICD-10-CM | POA: Diagnosis not present

## 2021-08-03 DIAGNOSIS — R269 Unspecified abnormalities of gait and mobility: Secondary | ICD-10-CM | POA: Insufficient documentation

## 2021-08-03 DIAGNOSIS — R3915 Urgency of urination: Secondary | ICD-10-CM | POA: Diagnosis not present

## 2021-08-03 NOTE — Progress Notes (Signed)
? ?[facility] ? ?PATIENT: Christina Carey ?DOB: May 13, 1955 ? ?REFERRING DOCTOR OR PCP: Janie Morning, DO ?SOURCE: Patient, notes from Dr. Jannifer Franklin and Debbora Presto, NP, imaging and lab reports, MRI images personally reviewed. ? ?_________________________________ ? ? ?HISTORICAL ? ?CHIEF COMPLAINT:  ?Chief Complaint  ?Patient presents with  ? Follow-up  ?  Rm 2, alone. Here to discuss MRI results from 3/28 and treatment options.   ? ? ?HISTORY OF PRESENT ILLNESS:  ?I had the pleasure of seeing your patient, Christina Carey, at the [place] at [facility] for neurologic consultation regarding her multiple sclerosis and treatment options. ? ?She is a 66 year old woman who was diagnosed with MS in 1996.  She has been fortunate to have a fairly mild course with minimal impairments over time.  She would like to discuss the possibility of stopping a disease modifying therapy. ? ?Her MS history was reviewed (see below). She has done very well and has few neurologic symptoms and no relapses since the 1990s.  She sometimes has right foot numbness.  She feels her gait is slightly off but she can walk >1 mile without difficulty.   She has had a few falls and she uses bannister on stairs.   She denies weakness.   She has mild bladder issues with some urgency but no incontinence.  Sometimes she does not completely empty and needs to go a second time.   She also has had rare  fecal incontinence.   Vision is fine. ? ?She has mild fatigue.   She is trying to lose weight and lost 40 pounds which has helped her feel better.   Sometimes she is sleepy.   She wakes up 2 times a night for the bathroom.    She occasioanlly falls asleep watching TV.  She used to snore but not with her weight loss.   She denies mood issues.  She has some mild word finding difficulty but no severe cognitive issues.   Se works in Press photographer. ? ?MS History ?She was diagnosed with MS in 1996 after presenting with left leg >  arm weakness.   She was concerned about a stroke and saw her PCP who ordered tha MRI that was found to be cw MS.  She had an LP to confirm the diagnosis.  She was placed on Betaseron.   She did well on the Betaseron and switched to Avonex due to injection site reactions and ischemic colitis.   She switced to Tecfidera around 2013 or 2014 but had GI issues and switched to [place].    ? ?IMAGING ?MRI of the brain 08/01/2021 showed some scattered T2/FLAIR hyperintense foci in the periventricular, juxtacortical and deep white matter of the hemispheres and possibly an additional punctate focus in the pons.  These are consistent with chronic demyelinating plaque associated with multiple sclerosis.  None of the foci enhanced after contrast.  Compared to the MRI from 01/07/2019, there are no new lesions.  The overall plaque burden is mild ? ?REVIEW OF SYSTEMS: ?Constitutional: No fevers, chills, sweats, or change in appetite ?Eyes: No visual changes, double vision, eye pain ?Ear, nose and throat: No hearing loss, ear pain, nasal congestion, sore throat ?Cardiovascular: No chest pain, palpitations ?Respiratory:  No shortness of breath at rest or with exertion.   No wheezes ?GastrointestinaI: No nausea, vomiting, diarrhea, abdominal pain, fecal incontinence ?Genitourinary:  No dysuria, urinary retention or frequency.  No nocturia. ?Musculoskeletal:  No neck pain, back pain ?Integumentary: No rash, pruritus,  skin lesions ?Neurological: as above ?Psychiatric: No depression at this time.  No anxiety ?Endocrine: No palpitations, diaphoresis, change in appetite, change in weigh or increased thirst ?Hematologic/Lymphatic:  No anemia, purpura, petechiae. ?Allergic/Immunologic: No itchy/runny eyes, nasal congestion, recent allergic reactions, rashes ? ?ALLERGIES: ?Allergies  ?Allergen Reactions  ? Sulfamethoxazole-Trimethoprim   ?  REACTION: rash, taking trimethoprim now and is not having a problem. 01-05-15.  ? ? ?HOME  MEDICATIONS: ? ?Current Outpatient Medications:  ?  Calcium Carbonate-Vitamin D (CALCIUM + D PO), Take 1,000 mg by mouth daily. , Disp: , Rfl:  ?  CINNAMON PO, Take 1,000 mg by mouth 2 (two) times daily., Disp: , Rfl:  ?  ESTRACE VAGINAL 0.1 MG/GM vaginal cream, 1 Applicatorful as needed. , Disp: , Rfl: 3 ?  Fingolimod HCl (GILENYA) 0.5 MG CAPS, Take 1 capsule (0.5 mg total) by mouth daily., Disp: 90 capsule, Rfl: 3 ?  levothyroxine (SYNTHROID, LEVOTHROID) 88 MCG tablet, Take 88 mcg by mouth daily., Disp: , Rfl: 12 ?  Methylcellulose, Laxative, (CITRUCEL) 500 MG TABS, Take 2 tablets by mouth daily., Disp: , Rfl:  ?  valsartan-hydrochlorothiazide (DIOVAN-HCT) 160-12.5 MG tablet, 1 tablet, Disp: , Rfl:  ? ?PAST MEDICAL HISTORY: ?Past Medical History:  ?Diagnosis Date  ? CKD stage 2 due to type 2 diabetes mellitus ([place])   ? Colitis 06/2015  ? admitted for suspected ischemic colitis  ? DM2 (diabetes mellitus, type 2) ([place])   ? GERD (gastroesophageal reflux disease)   ? High blood pressure   ? Hyperlipemia   ? Hypertension, renal 2016  ? [name]  ? MS (multiple sclerosis) ([place])   ? Thyroid disorder   ? ? ?PAST SURGICAL HISTORY: ?Past Surgical History:  ?Procedure Laterality Date  ? COLONOSCOPY    ? WISDOM TOOTH EXTRACTION    ? ? ?FAMILY HISTORY: ?Family History  ?Problem Relation Age of Onset  ? Ovarian cancer Mother   ? Lung cancer Father   ? Cancer Maternal Grandmother   ? Colon cancer Neg Hx   ? ? ?SOCIAL HISTORY: ? ?Social History  ? ?Socioeconomic History  ? Marital status: Married  ?  Spouse name: Purcell Nails  ? Number of children: 2  ? Years of education: 53  ? Highest education level: Not on file  ?Occupational History  ? Occupation: ACCOUNTING  ?  Employer: [employer]  ?Tobacco Use  ? Smoking status: Never  ? Smokeless tobacco: Never  ?Substance and Sexual Activity  ? Alcohol use: No  ?  Comment: caffeine drinks 1-2 daily  ? Drug use: No  ? Sexual activity: Not on file  ?Other Topics Concern  ? Not on file   ?Social History Narrative  ? Patient lives at home with her husband Farrel Gordon. and they have 2 adult children. 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   ? Patient is right- handed.  ? Patient drinks one cup of coffee daily and soda very rarely.  ? ?Social Determinants of Health  ? ?Financial Resource Strain: Not on file  ?Food Insecurity: Not on file  ?Transportation Needs: Not on file  ?Physical Activity: Not on file  ?Stress: Not on file  ?Social Connections: Not on file  ?Intimate Partner Violence: Not on file  ? ? ? ?PHYSICAL EXAM ? ?Vitals:  ? 08/03/21 1359  ?BP: 137/80  ?Pulse: 76  ?Weight: 174 lb 8 oz (79.2 kg)  ?Height: '5\' 7"'$  (1.702 m)  ? ? ?Body mass index is 27.33 kg/m?. ? ? ?  General: The patient is well-developed and well-nourished and in no acute distress ? ?HEENT:  Head is [place]/AT.  Sclera are anicteric.  Funduscopic exam shows normal optic discs and retinal vessels. ? ?Neck: No carotid bruits are noted.  The neck is nontender. ? ?Cardiovascular: The heart has a regular rate and rhythm with a normal S1 and S2. There were no murmurs, gallops or rubs.   ? ?Skin: Extremities are without rash or  edema. ? ?Musculoskeletal:  Back is nontender ? ?Neurologic Exam ? ?Mental status: The patient is alert and oriented x 3 at the time of the examination. The patient has apparent normal recent and remote memory, with an apparently normal attention span and concentration ability.   Speech is normal. ? ?Cranial nerves: Extraocular movements are full. Pupils are equal, round, and reactive to light and accomodation.  Visual fields are full.  Facial symmetry is present. There is good facial sensation to soft touch bilaterally.Facial strength is normal.  Trapezius and sternocleidomastoid strength is normal. No dysarthria is noted.  The tongue is midline, and the patient has symmetric elevation of the soft palate. No obvious hearing deficits are noted. ? ?Motor:  Muscle bulk is normal.   Tone is  normal. Strength is  5 / 5 in all 4 extremities.  ? ?Sensory: Sensory testing is intact to pinprick, soft touch and vibration sensation in all 4 extremities. ? ?Coordination: Cerebellar testing reveals good finger-nose-fin

## 2021-10-25 ENCOUNTER — Ambulatory Visit: Payer: Commercial Managed Care - PPO | Admitting: Neurology

## 2021-12-22 DIAGNOSIS — I129 Hypertensive chronic kidney disease with stage 1 through stage 4 chronic kidney disease, or unspecified chronic kidney disease: Secondary | ICD-10-CM | POA: Diagnosis not present

## 2021-12-22 DIAGNOSIS — E039 Hypothyroidism, unspecified: Secondary | ICD-10-CM | POA: Diagnosis not present

## 2021-12-22 DIAGNOSIS — E7800 Pure hypercholesterolemia, unspecified: Secondary | ICD-10-CM | POA: Diagnosis not present

## 2021-12-22 DIAGNOSIS — R7309 Other abnormal glucose: Secondary | ICD-10-CM | POA: Diagnosis not present

## 2021-12-22 DIAGNOSIS — E559 Vitamin D deficiency, unspecified: Secondary | ICD-10-CM | POA: Diagnosis not present

## 2021-12-22 DIAGNOSIS — D709 Neutropenia, unspecified: Secondary | ICD-10-CM | POA: Diagnosis not present

## 2021-12-22 DIAGNOSIS — Z0000 Encounter for general adult medical examination without abnormal findings: Secondary | ICD-10-CM | POA: Diagnosis not present

## 2021-12-29 DIAGNOSIS — Z0000 Encounter for general adult medical examination without abnormal findings: Secondary | ICD-10-CM | POA: Diagnosis not present

## 2021-12-29 DIAGNOSIS — D709 Neutropenia, unspecified: Secondary | ICD-10-CM | POA: Diagnosis not present

## 2021-12-29 DIAGNOSIS — G35 Multiple sclerosis: Secondary | ICD-10-CM | POA: Diagnosis not present

## 2021-12-29 DIAGNOSIS — E039 Hypothyroidism, unspecified: Secondary | ICD-10-CM | POA: Diagnosis not present

## 2021-12-29 DIAGNOSIS — E7800 Pure hypercholesterolemia, unspecified: Secondary | ICD-10-CM | POA: Diagnosis not present

## 2021-12-29 DIAGNOSIS — R7309 Other abnormal glucose: Secondary | ICD-10-CM | POA: Diagnosis not present

## 2021-12-29 DIAGNOSIS — Z13820 Encounter for screening for osteoporosis: Secondary | ICD-10-CM | POA: Diagnosis not present

## 2022-02-03 DIAGNOSIS — N3090 Cystitis, unspecified without hematuria: Secondary | ICD-10-CM | POA: Diagnosis not present

## 2022-02-05 NOTE — Patient Instructions (Signed)
Below is our plan:  We will continue to monitor.   Please make sure you are staying well hydrated. I recommend 50-60 ounces daily. Well balanced diet and regular exercise encouraged. Consistent sleep schedule with 6-8 hours recommended.   Please continue follow up with care team as directed.   Follow up with Dr Epimenio Foot in 6 months, may consider annual follow up afterwards.   You may receive a survey regarding today's visit. I encourage you to leave honest feed back as I do use this information to improve patient care. Thank you for seeing me today!

## 2022-02-05 NOTE — Progress Notes (Unsigned)
PATIENT: Christina Carey DOB: 04-08-1956  REASON FOR VISIT: follow up HISTORY FROM: patient  No chief complaint on file.    HISTORY OF PRESENT ILLNESS:  02/05/22 ALL: Christina Carey returns for follow up for RRMS. She was seen in consult with Dr Felecia Shelling 07/2021. Gilenya was discontinued due to age and no new lesions in 20 years. MRI brian stable 07/2021. Annual imaging x 3 years recommended. Labs were stable 10/2020. She had repeat CBC with PCP in 12/2020 and lymphocyte count 0.4. Liver enzymes normal.   She continues to do very well. No new or exacerbating symptoms. She denies difficulty with gait. She does have occasional imbalance if in wide open spaces. She feels that it is more that she is nervous if there is not something close to hold on to if needed. This is rarely an issue. No assistive devices or falls.   She sleeps well. Mood is good. She does have mild urinary frequency and urge incontinence but feels it is better on Estrace cream.   History (copied from Dr Garth Bigness previous note)  She is a 66 year old woman who was diagnosed with MS in 1996.  She has been fortunate to have a fairly mild course with minimal impairments over time.  She would like to discuss the possibility of stopping a disease modifying therapy.   Her MS history was reviewed (see below). She has done very well and has few neurologic symptoms and no relapses since the 1990s.  She sometimes has right foot numbness.  She feels her gait is slightly off but she can walk >1 mile without difficulty.   She has had a few falls and she uses bannister on stairs.   She denies weakness.   She has mild bladder issues with some urgency but no incontinence.  Sometimes she does not completely empty and needs to go a second time.   She also has had rare  fecal incontinence.   Vision is fine.   She has mild fatigue.   She is trying to lose weight and lost 40 pounds which has helped her feel better.   Sometimes she is sleepy.   She wakes up  2 times a night for the bathroom.    She occasioanlly falls asleep watching TV.  She used to snore but not with her weight loss.   She denies mood issues.  She has some mild word finding difficulty but no severe cognitive issues.   Se works in Press photographer.   MS History She was diagnosed with MS in 1996 after presenting with left leg > arm weakness.   She was concerned about a stroke and saw her PCP who ordered tha MRI that was found to be cw MS.  She had an LP to confirm the diagnosis.  She was placed on Betaseron.   She did well on the Betaseron and switched to Avonex due to injection site reactions and ischemic colitis.   She switced to Tecfidera around 2013 or 2014 but had GI issues and switched to [place].      IMAGING MRI of the brain 08/01/2021 showed some scattered T2/FLAIR hyperintense foci in the periventricular, juxtacortical and deep white matter of the hemispheres and possibly an additional punctate focus in the pons.  These are consistent with chronic demyelinating plaque associated with multiple sclerosis.  None of the foci enhanced after contrast.  Compared to the MRI from 01/07/2019, there are no new lesions.  The overall plaque burden is mild   REVIEW OF  SYSTEMS: Out of a complete 14 system review of symptoms, the patient complains only of the following symptoms, urinary frequency, urge incontinence and all other reviewed systems are negative.  ALLERGIES: Allergies  Allergen Reactions   Sulfamethoxazole-Trimethoprim     REACTION: rash, taking trimethoprim now and is not having a problem. 01-05-15.    HOME MEDICATIONS: Outpatient Medications Prior to Visit  Medication Sig Dispense Refill   Calcium Carbonate-Vitamin D (CALCIUM + D PO) Take 1,000 mg by mouth daily.      CINNAMON PO Take 1,000 mg by mouth 2 (two) times daily.     ESTRACE VAGINAL 0.1 MG/GM vaginal cream 1 Applicatorful as needed.   3   Fingolimod HCl (GILENYA) 0.5 MG CAPS Take 1 capsule (0.5 mg total) by mouth daily.  90 capsule 3   levothyroxine (SYNTHROID, LEVOTHROID) 88 MCG tablet Take 88 mcg by mouth daily.  12   Methylcellulose, Laxative, (CITRUCEL) 500 MG TABS Take 2 tablets by mouth daily.     valsartan-hydrochlorothiazide (DIOVAN-HCT) 160-12.5 MG tablet 1 tablet     No facility-administered medications prior to visit.    PAST MEDICAL HISTORY: Past Medical History:  Diagnosis Date   CKD stage 2 due to type 2 diabetes mellitus ([place])    Colitis 06/2015   admitted for suspected ischemic colitis   DM2 (diabetes mellitus, type 2) ([facility])    GERD (gastroesophageal reflux disease)    High blood pressure    Hyperlipemia    Hypertension, renal 2016   [name]   MS (multiple sclerosis) [facility])    Thyroid disorder     PAST SURGICAL HISTORY: Past Surgical History:  Procedure Laterality Date   COLONOSCOPY     WISDOM TOOTH EXTRACTION      FAMILY HISTORY: Family History  Problem Relation Age of Onset   Ovarian cancer Mother    Lung cancer Father    Cancer Maternal Grandmother    Colon cancer Neg Hx     SOCIAL HISTORY: Social History   Socioeconomic History   Marital status: Married    Spouse name: Lawrence   Number of children: 2   Years of education: 16   Highest education level: Not on file  Occupational History   Occupation: ACCOUNTING    Employer: [employer]  Tobacco Use   Smoking status: Never   Smokeless tobacco: Never  Substance and Sexual Activity   Alcohol use: No    Comment: caffeine drinks 1-2 daily   Drug use: No   Sexual activity: Not on file  Other Topics Concern   Not on file  Social History Narrative   Patient lives at home with her husband Farrel Gordon. and they have 2 adult children. 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    Patient is right- handed.   Patient drinks one cup of coffee daily and soda very rarely.   Social Determinants of Health   Financial Resource Strain: Not on file  Food Insecurity: Not on file   Transportation Needs: Not on file  Physical Activity: Not on file  Stress: Not on file  Social Connections: Not on file  Intimate Partner Violence: Not on file      PHYSICAL EXAM  There were no vitals filed for this visit.   There is no height or weight on file to calculate BMI.  Generalized: Well developed, in no acute distress  Cardiology: normal rate and rhythm, no murmur noted Neurological examination  Mentation: Alert oriented to time, place,  history taking. Follows all commands speech and language fluent Cranial nerve II-XII: Pupils were equal round reactive to light. Extraocular movements were full, visual field were full on confrontational test. Facial sensation and strength were normal. Head turning and shoulder shrug  were normal and symmetric. Motor: The motor testing reveals 5 over 5 strength of all 4 extremities. Good symmetric motor tone is noted throughout.  Sensory: Sensory testing is intact to soft touch on all 4 extremities. No evidence of extinction is noted.  Coordination: Cerebellar testing reveals good finger-nose-finger and heel-to-shin bilaterally.  Gait and station: Gait is normal.    DIAGNOSTIC DATA (LABS, IMAGING, TESTING) - I reviewed patient records, labs, notes, testing and imaging myself where available.      No data to display           Lab Results  Component Value Date   WBC 3.2 (L) 10/20/2020   HGB 13.8 10/20/2020   HCT 41.0 10/20/2020   MCV 89 10/20/2020   PLT 262 10/20/2020      Component Value Date/Time   NA 142 10/20/2020 0833   K 3.8 10/20/2020 0833   CL 104 10/20/2020 0833   CO2 25 10/20/2020 0833   GLUCOSE 114 (H) 10/20/2020 0833   GLUCOSE 97 06/26/2015 1538   BUN 13 10/20/2020 0833   CREATININE 0.86 10/20/2020 0833   CALCIUM 9.6 10/20/2020 0833   PROT 6.4 10/20/2020 0833   ALBUMIN 4.1 10/20/2020 0833   AST 22 10/20/2020 0833   ALT 23 10/20/2020 0833   ALKPHOS 79 10/20/2020 0833   BILITOT 0.2 10/20/2020 0833    GFRNONAA 76 01/01/2019 0833   GFRAA 87 01/01/2019 0833   No results found for: "CHOL", "HDL", "LDLCALC", "LDLDIRECT", "TRIG", "CHOLHDL" No results found for: "HGBA1C" No results found for: "VITAMINB12" No results found for: "TSH"    ASSESSMENT AND PLAN 66 y.o. year old female  has a past medical history of CKD stage 2 due to type 2 diabetes mellitus ([place]), Colitis (06/2015), DM2 (diabetes mellitus, type 2) ([place]), GERD (gastroesophageal reflux disease), High blood pressure, Hyperlipemia, Hypertension, renal (2016), MS (multiple sclerosis) ([place]), and Thyroid disorder. here with   No diagnosis found.    Virgilene is doing very well on Gilenya.  She denies adverse effects.  No new symptoms of numbness, vision changes, bladder/bowel or gait changes. Labs stable 12/2020. Last MRI 2020 stable. We will repeat for monitoring. She was encouraged to continue regular follow-up with ophthalmology. I will have her establish care with Dr Felecia Shelling in Dr Jannifer Franklin' retirement. She would like to consider stopping DMT if Dr Felecia Shelling feels this is reasonable. She is aware to call with any new or worsening concerns.  She verbalizes understanding and agreement with this plan.   No orders of the defined types were placed in this encounter.    No orders of the defined types were placed in this encounter.    Debbora Presto, FNP-C 02/05/2022, 8:41 AM [facility] [address] [place], [place] [address] [phone]

## 2022-02-07 ENCOUNTER — Encounter: Payer: Self-pay | Admitting: Family Medicine

## 2022-02-07 ENCOUNTER — Ambulatory Visit: Payer: Commercial Managed Care - PPO | Admitting: Family Medicine

## 2022-02-07 ENCOUNTER — Ambulatory Visit: Payer: Medicare Other | Admitting: Family Medicine

## 2022-02-07 VITALS — BP 127/78 | HR 64 | Ht 67.0 in | Wt 172.0 lb

## 2022-02-07 DIAGNOSIS — G35 Multiple sclerosis: Secondary | ICD-10-CM | POA: Diagnosis not present

## 2022-03-05 DIAGNOSIS — M79671 Pain in right foot: Secondary | ICD-10-CM | POA: Diagnosis not present

## 2022-03-05 DIAGNOSIS — M25571 Pain in right ankle and joints of right foot: Secondary | ICD-10-CM | POA: Diagnosis not present

## 2022-04-04 DIAGNOSIS — M25571 Pain in right ankle and joints of right foot: Secondary | ICD-10-CM | POA: Diagnosis not present

## 2022-05-15 DIAGNOSIS — H40013 Open angle with borderline findings, low risk, bilateral: Secondary | ICD-10-CM | POA: Diagnosis not present

## 2022-05-15 DIAGNOSIS — H25813 Combined forms of age-related cataract, bilateral: Secondary | ICD-10-CM | POA: Diagnosis not present

## 2022-05-15 DIAGNOSIS — Z79899 Other long term (current) drug therapy: Secondary | ICD-10-CM | POA: Diagnosis not present

## 2022-05-15 DIAGNOSIS — H04123 Dry eye syndrome of bilateral lacrimal glands: Secondary | ICD-10-CM | POA: Diagnosis not present

## 2022-07-05 ENCOUNTER — Telehealth: Payer: Medicare Other | Admitting: Nurse Practitioner

## 2022-07-05 DIAGNOSIS — N3000 Acute cystitis without hematuria: Secondary | ICD-10-CM

## 2022-07-05 MED ORDER — CEPHALEXIN 500 MG PO CAPS: 500.0000 mg | ORAL_CAPSULE | Freq: Two times a day (BID) | ORAL | 0 refills | Status: AC

## 2022-07-05 NOTE — Progress Notes (Signed)
E-Visit for Urinary Problems  We are sorry that you are not feeling well.  Here is how we plan to help!  Based on what you shared with me it looks like you most likely have a simple urinary tract infection.  A UTI (Urinary Tract Infection) is a bacterial infection of the bladder.  Most cases of urinary tract infections are simple to treat but a key part of your care is to encourage you to drink plenty of fluids and watch your symptoms carefully.  I have prescribed Keflex 500 mg twice a day for 7 days.  Your symptoms should gradually improve. Call us if the burning in your urine worsens, you develop worsening fever, back pain or pelvic pain or if your symptoms do not resolve after completing the antibiotic.  Urinary tract infections can be prevented by drinking plenty of water to keep your body hydrated.  Also be sure when you wipe, wipe from front to back and don't hold it in!  If possible, empty your bladder every 4 hours.  HOME CARE Drink plenty of fluids Compete the full course of the antibiotics even if the symptoms resolve Remember, when you need to go.go. Holding in your urine can increase the likelihood of getting a UTI! GET HELP RIGHT AWAY IF: You cannot urinate You get a high fever Worsening back pain occurs You see blood in your urine You feel sick to your stomach or throw up You feel like you are going to pass out  MAKE SURE YOU  Understand these instructions. Will watch your condition. Will get help right away if you are not doing well or get worse.   Thank you for choosing an e-visit.  Your e-visit answers were reviewed by a board certified advanced clinical practitioner to complete your personal care plan. Depending upon the condition, your plan could have included both over the counter or prescription medications.  Please review your pharmacy choice. Make sure the pharmacy is open so you can pick up prescription now. If there is a problem, you may contact your  provider through [employer] and have the prescription routed to another pharmacy.  Your safety is important to [place]. If you have drug allergies check your prescription carefully.   For the next 24 hours you can use MyChart to ask questions about today's visit, request a non-urgent call back, or ask for a work or school excuse. You will get an email in the next two days asking about your experience. I hope that your e-visit has been valuable and will speed your recovery.   Meds ordered this encounter  Medications   cephALEXin (KEFLEX) 500 MG capsule    Sig: Take 1 capsule (500 mg total) by mouth 2 (two) times daily for 7 days.    Dispense:  14 capsule    Refill:  0     I spent approximately 5 minutes reviewing the patient's history, current symptoms and coordinating their care today.

## 2022-08-07 DIAGNOSIS — R300 Dysuria: Secondary | ICD-10-CM | POA: Diagnosis not present

## 2022-08-13 NOTE — Progress Notes (Unsigned)
[facility]  PATIENT: Christina Carey DOB: May 24, 1955  REFERRING DOCTOR OR PCP: Irena Reichmann, DO SOURCE: Patient, notes from Dr. Anne Hahn and Shawnie Dapper, NP, imaging and lab reports, MRI images personally reviewed.  _________________________________   HISTORICAL  CHIEF COMPLAINT:  No chief complaint on file.   HISTORY OF PRESENT ILLNESS:  I had the pleasure of seeing your patient, Christina Carey, at the [facility] at [facility] for neurologic consultation regarding her multiple sclerosis and treatment options.  Christina Carey is a 67 year old woman who was diagnosed with MS in 1996.  Christina Carey has been fortunate to have a fairly mild course with minimal impairments over time.  Christina Carey would like to discuss the possibility of stopping a disease modifying therapy.  Her MS history was reviewed (see below). Christina Carey has done very well and has few neurologic symptoms and no relapses since the 1990s.  Christina Carey sometimes has right foot numbness.  Christina Carey feels her gait is slightly off but Christina Carey can walk >1 mile without difficulty.   Christina Carey has had a few falls and Christina Carey uses bannister on stairs.   Christina Carey denies weakness.   Christina Carey has mild bladder issues with some urgency but no incontinence.  Sometimes Christina Carey does not completely empty and needs to go a second time.   Christina Carey also has had rare  fecal incontinence.   Vision is fine.  Christina Carey has mild fatigue.   Christina Carey is trying to lose weight and lost 40 pounds which has helped her feel better.   Sometimes Christina Carey is sleepy.   Christina Carey wakes up 2 times a night for the bathroom.    Christina Carey occasioanlly falls asleep watching TV.  Christina Carey used to snore but not with her weight loss.   Christina Carey denies mood issues.  Christina Carey has some mild word finding difficulty but no severe cognitive issues.   Se works in Audiological scientist.  MS History Christina Carey was diagnosed with MS in 1996 after presenting with left leg > arm weakness.   Christina Carey was concerned about a stroke and saw her PCP who ordered tha MRI that was found to be  cw MS.  Christina Carey had an LP to confirm the diagnosis.  Christina Carey was placed on Betaseron.   Christina Carey did well on the Betaseron and switched to Avonex due to injection site reactions and ischemic colitis.   Christina Carey switced to Tecfidera around 2013 or 2014 but had GI issues and switched to Gilenya.     IMAGING MRI of the brain 08/01/2021 showed some scattered T2/FLAIR hyperintense foci in the periventricular, juxtacortical and deep white matter of the hemispheres and possibly an additional punctate focus in the pons.  These are consistent with chronic demyelinating plaque associated with multiple sclerosis.  None of the foci enhanced after contrast.  Compared to the MRI from 01/07/2019, there are no new lesions.  The overall plaque burden is mild  REVIEW OF SYSTEMS: Constitutional: No fevers, chills, sweats, or change in appetite Eyes: No visual changes, double vision, eye pain Ear, nose and throat: No hearing loss, ear pain, nasal congestion, sore throat Cardiovascular: No chest pain, palpitations Respiratory:  No shortness of breath at rest or with exertion.   No wheezes GastrointestinaI: No nausea, vomiting, diarrhea, abdominal pain, fecal incontinence Genitourinary:  No dysuria, urinary retention or frequency.  No nocturia. Musculoskeletal:  No neck pain, back pain Integumentary: No rash, pruritus, skin lesions Neurological: as above Psychiatric: No depression at this time.  No anxiety Endocrine: No palpitations, diaphoresis, change in appetite, change  in weigh or increased thirst Hematologic/Lymphatic:  No anemia, purpura, petechiae. Allergic/Immunologic: No itchy/runny eyes, nasal congestion, recent allergic reactions, rashes  ALLERGIES: Allergies  Allergen Reactions   Amlodipine     Other Reaction(s): leg swelling   Sulfamethoxazole-Trimethoprim     REACTION: rash, taking trimethoprim now and is not having a problem. 01-05-15.    HOME MEDICATIONS:  Current Outpatient Medications:    Calcium  Carbonate-Vitamin D (CALCIUM + D PO), Take 1,000 mg by mouth daily. , Disp: , Rfl:    CINNAMON PO, Take 1,000 mg by mouth 2 (two) times daily., Disp: , Rfl:    ESTRACE VAGINAL 0.1 MG/GM vaginal cream, 1 Applicatorful as needed. , Disp: , Rfl: 3   levothyroxine (SYNTHROID, LEVOTHROID) 88 MCG tablet, Take 88 mcg by mouth daily., Disp: , Rfl: 12   Methylcellulose, Laxative, (CITRUCEL) 500 MG TABS, Take 2 tablets by mouth daily., Disp: , Rfl:    valsartan-hydrochlorothiazide (DIOVAN-HCT) 160-12.5 MG tablet, 1 tablet, Disp: , Rfl:   PAST MEDICAL HISTORY: Past Medical History:  Diagnosis Date   CKD stage 2 due to type 2 diabetes mellitus (HCC)    Colitis 06/2015   admitted for suspected ischemic colitis   DM2 (diabetes mellitus, type 2) (HCC)    GERD (gastroesophageal reflux disease)    High blood pressure    Hyperlipemia    Hypertension, renal 2016   [name]   MS (multiple sclerosis) [facility])    Thyroid disorder     PAST SURGICAL HISTORY: Past Surgical History:  Procedure Laterality Date   COLONOSCOPY     WISDOM TOOTH EXTRACTION      FAMILY HISTORY: Family History  Problem Relation Age of Onset   Ovarian cancer Mother    Lung cancer Father    Cancer Maternal Grandmother    Colon cancer Neg Hx     SOCIAL HISTORY:  Social History   Socioeconomic History   Marital status: Married    Spouse name: Lawrence   Number of children: 2   Years of education: 16   Highest education level: Not on file  Occupational History   Occupation: ACCOUNTING    Employer: [employer]  Tobacco Use   Smoking status: Never   Smokeless tobacco: Never  Substance and Sexual Activity   Alcohol use: No    Comment: caffeine drinks 1-2 daily   Drug use: No   Sexual activity: Not on file  Other Topics Concern   Not on file  Social History Narrative   Patient lives at home with her husband Dereck Leep. and they have 2 adult children. Christina Carey has completed some college and works as an  Audiological scientist.    Patient is right- handed.   Patient drinks one cup of coffee daily and soda very rarely.   Social Determinants of Health   Financial Resource Strain: Not on file  Food Insecurity: Not on file  Transportation Needs: Not on file  Physical Activity: Not on file  Stress: Not on file  Social Connections: Not on file  Intimate Partner Violence: Not on file     PHYSICAL EXAM  There were no vitals filed for this visit.   There is no height or weight on file to calculate BMI.   General: The patient is well-developed and well-nourished and in no acute distress  HEENT:  Head is [place]/AT.  Sclera are anicteric.  Funduscopic exam shows normal optic discs and retinal vessels.  Neck: No carotid bruits are noted.  The neck is nontender.  Cardiovascular: The heart has a regular rate and rhythm with a normal S1 and S2. There were no murmurs, gallops or rubs.    Skin: Extremities are without rash or  edema.  Musculoskeletal:  Back is nontender  Neurologic Exam  Mental status: The patient is alert and oriented x 3 at the time of the examination. The patient has apparent normal recent and remote memory, with an apparently normal attention span and concentration ability.   Speech is normal.  Cranial nerves: Extraocular movements are full. Pupils are equal, round, and reactive to light and accomodation.  Visual fields are full.  Facial symmetry is present. There is good facial sensation to soft touch bilaterally.Facial strength is normal.  Trapezius and sternocleidomastoid strength is normal. No dysarthria is noted.  The tongue is midline, and the patient has symmetric elevation of the soft palate. No obvious hearing deficits are noted.  Motor:  Muscle bulk is normal.   Tone is normal. Strength is  5 / 5 in all 4 extremities.   Sensory: Sensory testing is intact to pinprick, soft touch and vibration sensation in all 4 extremities.  Coordination: Cerebellar testing reveals good  finger-nose-finger and heel-to-shin bilaterally.  Gait and station: Station is normal.   Gait is normal. Tandem gait is mildly wide. Romberg is negative.   Reflexes: Deep tendon reflexes are symmetric and normal in arms and 3+ at knees (spread) .       DIAGNOSTIC DATA (LABS, IMAGING, TESTING) - I reviewed patient records, labs, notes, testing and imaging myself where available.  Lab Results  Component Value Date   WBC 3.2 (L) 10/20/2020   HGB 13.8 10/20/2020   HCT 41.0 10/20/2020   MCV 89 10/20/2020   PLT 262 10/20/2020      Component Value Date/Time   NA 142 10/20/2020 0833   K 3.8 10/20/2020 0833   CL 104 10/20/2020 0833   CO2 25 10/20/2020 0833   GLUCOSE 114 (H) 10/20/2020 0833   GLUCOSE 97 06/26/2015 1538   BUN 13 10/20/2020 0833   CREATININE 0.86 10/20/2020 0833   CALCIUM 9.6 10/20/2020 0833   PROT 6.4 10/20/2020 0833   ALBUMIN 4.1 10/20/2020 0833   AST 22 10/20/2020 0833   ALT 23 10/20/2020 0833   ALKPHOS 79 10/20/2020 0833   BILITOT 0.2 10/20/2020 0833   GFRNONAA 76 01/01/2019 0833   GFRAA 87 01/01/2019 0833       ASSESSMENT AND PLAN  No diagnosis found.   In summary, Christina Carey is a 67 year old woman with multiple sclerosis is 1996 who has been fortunate to have a relatively mild course with no exacerbations since the 1990s and stable MRIs for at least the last several years.  We discussed that multiple sclerosis tends to be associated with more aggressiveness with new lesions in the first 5 to 10 years and more slow progression that does not respond as well to medications after 10 to 20 years.  Additionally, the immune system shows some senescence beginning in the 55s which may also play a role in fewer relapses and new lesions.  Because Christina Carey is greater than 60, has had no exacerbations for 20 years and has had no recent new MRI activity, I feel it is reasonable for her to do a trial off of a disease modifying therapy.  We discussed that I would want to  check an MRI of the brain annually for the next 3 years to determine if there is any subclinical progression.  If this is  occurring, we can always have her go back on the medication.  Christina Carey is in agreement with this plan.  Christina Carey is advised to stay active and exercise as tolerated.  We discussed that a heart healthy diet is also brain healthy diet and I recommend the Mediterranean or similar diet.  Christina Carey will return to see us in 6 months or sooner for new or worsening neurologic symptoms.  43-minute office visit with the majority of the time spent face-to-face for history and physical, discussion/counseling and decision-making.  Additional time with record review and documentation.     Lazette Estala A. Epimenio FootSater, MD, [facility],FAAN 08/13/2022, 7:51 PM Certified in Neurology, Clinical Neurophysiology, Sleep Medicine and Neuroimaging  [facility] [address], [address] [place], [place] [address] [phone]

## 2022-08-14 ENCOUNTER — Ambulatory Visit: Payer: Medicare Other | Admitting: Neurology

## 2022-08-14 ENCOUNTER — Telehealth: Payer: Self-pay | Admitting: Neurology

## 2022-08-14 ENCOUNTER — Encounter: Payer: Self-pay | Admitting: Neurology

## 2022-08-14 VITALS — BP 122/76 | HR 64 | Ht 67.0 in | Wt 177.0 lb

## 2022-08-14 DIAGNOSIS — R3915 Urgency of urination: Secondary | ICD-10-CM

## 2022-08-14 DIAGNOSIS — R269 Unspecified abnormalities of gait and mobility: Secondary | ICD-10-CM | POA: Diagnosis not present

## 2022-08-14 DIAGNOSIS — G35 Multiple sclerosis: Secondary | ICD-10-CM

## 2022-08-14 NOTE — Telephone Encounter (Signed)
[employer] medicare Berkley Harvey: [phone] exp. [id number]/12/22 sent to GI [phone]

## 2022-08-30 ENCOUNTER — Ambulatory Visit
Admission: RE | Admit: 2022-08-30 | Discharge: 2022-08-30 | Disposition: A | Discharge status: Home / Self Care | Payer: Medicare Other | Source: Ambulatory Visit | Attending: Neurology | Admitting: Neurology

## 2022-08-30 DIAGNOSIS — R269 Unspecified abnormalities of gait and mobility: Secondary | ICD-10-CM

## 2022-08-30 DIAGNOSIS — G35 Multiple sclerosis: Secondary | ICD-10-CM

## 2022-08-30 MED ORDER — GADOPICLENOL 0.5 MMOL/ML IV SOLN
8.0000 mL | Freq: Once | INTRAVENOUS | Status: AC | PRN
  Administered 2022-08-30: 8 mL via INTRAVENOUS

## 2022-09-25 DIAGNOSIS — K080 Exfoliation of teeth due to systemic causes: Secondary | ICD-10-CM | POA: Diagnosis not present

## 2022-10-22 DIAGNOSIS — D225 Melanocytic nevi of trunk: Secondary | ICD-10-CM | POA: Diagnosis not present

## 2022-10-22 DIAGNOSIS — B360 Pityriasis versicolor: Secondary | ICD-10-CM | POA: Diagnosis not present

## 2022-10-22 DIAGNOSIS — Z1283 Encounter for screening for malignant neoplasm of skin: Secondary | ICD-10-CM | POA: Diagnosis not present

## 2022-11-29 DIAGNOSIS — Z6827 Body mass index (BMI) 27.0-27.9, adult: Secondary | ICD-10-CM | POA: Diagnosis not present

## 2022-11-29 DIAGNOSIS — Z01419 Encounter for gynecological examination (general) (routine) without abnormal findings: Secondary | ICD-10-CM | POA: Diagnosis not present

## 2022-11-29 DIAGNOSIS — Z1231 Encounter for screening mammogram for malignant neoplasm of breast: Secondary | ICD-10-CM | POA: Diagnosis not present

## 2022-11-30 ENCOUNTER — Ambulatory Visit
Admission: EM | Admit: 2022-11-30 | Discharge: 2022-11-30 | Disposition: A | Discharge status: Home / Self Care | Payer: Medicare Other | Attending: Internal Medicine | Admitting: Internal Medicine

## 2022-11-30 DIAGNOSIS — N3001 Acute cystitis with hematuria: Secondary | ICD-10-CM | POA: Diagnosis not present

## 2022-11-30 DIAGNOSIS — R300 Dysuria: Secondary | ICD-10-CM | POA: Diagnosis not present

## 2022-11-30 LAB — POCT URINALYSIS DIP (MANUAL ENTRY)
Bilirubin, UA: NEGATIVE
Glucose, UA: NEGATIVE mg/dL
Ketones, POC UA: NEGATIVE mg/dL
Nitrite, UA: NEGATIVE
Protein Ur, POC: NEGATIVE mg/dL
Spec Grav, UA: 1.015 (ref 1.010–1.025)
Urobilinogen, UA: 0.2 E.U./dL — AB
pH, UA: 6.5 (ref 5.0–8.0)

## 2022-11-30 MED ORDER — CIPROFLOXACIN HCL 500 MG PO TABS: 500.0000 mg | ORAL_TABLET | Freq: Two times a day (BID) | ORAL | 0 refills | Status: DC

## 2022-11-30 NOTE — ED Triage Notes (Signed)
Pt states pain with urination and pain to her vagina since yesterday with cloudy urine.  States she gets frequent UTI's and these are her normal symptoms.

## 2022-11-30 NOTE — ED Provider Notes (Signed)
[facility]    CSN: [id number] Arrival date & time: 11/30/22  1146      History   Chief Complaint Chief Complaint  Patient presents with   Dysuria    HPI Christina Carey is a 67 y.o. female.   Patient presents with dysuria and urinary frequency that started yesterday.  Reports she gets frequent urinary tract infections and this feels similar.  Patient not reporting any vaginal discharge.  Denies abdominal pain, back pain, fever, chills, nausea, vomiting.  Reports that she had multiple urinary tract infections at the beginning of the year but has been infection free since April.   Dysuria   Past Medical History:  Diagnosis Date   CKD stage 2 due to type 2 diabetes mellitus (HCC)    Colitis 06/2015   admitted for suspected ischemic colitis   DM2 (diabetes mellitus, type 2) (HCC)    GERD (gastroesophageal reflux disease)    High blood pressure    Hyperlipemia    Hypertension, renal 2016   Christina Brunette   MS (multiple sclerosis) [facility])    Thyroid disorder     Patient Active Problem List   Diagnosis Date Noted   Urinary urgency 08/03/2021   Gait disturbance 08/03/2021   Ischemic colitis (HCC) 01/02/2018   IBS (irritable bowel syndrome) 05/02/2017   Fecal incontinence 05/02/2017   Colitis 06/27/2015   Encounter for therapeutic drug monitoring 01/05/2015   MULTIPLE SCLEROSIS 05/26/2008   HYPERTENSION 05/26/2008   HIATAL HERNIA 05/26/2008   CONSTIPATION, CHRONIC 05/26/2008   HEMOCCULT POSITIVE STOOL 05/26/2008   CHEST PAIN 05/26/2008   ABDOMINAL PAIN, UNSPECIFIED SITE 05/26/2008    Past Surgical History:  Procedure Laterality Date   COLONOSCOPY     WISDOM TOOTH EXTRACTION      OB History   No obstetric history on file.      Home Medications    Prior to Admission medications   Medication Sig Start Date End Date Taking? Authorizing Provider  ciprofloxacin (CIPRO) 500 MG tablet Take 1 tablet (500 mg total) by mouth every 12 (twelve) hours.  11/30/22  Yes , Rolly Salter E, FNP  Calcium Carbonate-Vitamin D (CALCIUM + D PO) Take 1,000 mg by mouth daily.     [provider]  CINNAMON PO Take 1,000 mg by mouth 2 (two) times daily.    [provider]  ESTRACE VAGINAL 0.1 MG/GM vaginal cream 1 Applicatorful as needed.  06/20/15   [provider]  levothyroxine (SYNTHROID, LEVOTHROID) 88 MCG tablet Take 88 mcg by mouth daily. 04/06/17   [provider]  Methylcellulose, Laxative, (CITRUCEL) 500 MG TABS Take 2 tablets by mouth daily.    [provider]  valsartan-hydrochlorothiazide (DIOVAN-HCT) 160-12.5 MG tablet 1 tablet 08/11/20   [provider]    Family History Family History  Problem Relation Age of Onset   Ovarian cancer Mother    Lung cancer Father    Cancer Maternal Grandmother    Colon cancer Neg Hx     Social History Social History   Tobacco Use   Smoking status: Never   Smokeless tobacco: Never  Substance Use Topics   Alcohol use: No    Comment: caffeine drinks 1-2 daily   Drug use: No     Allergies   Amlodipine and Sulfamethoxazole-trimethoprim   Review of Systems Review of Systems Per HPI  Physical Exam Triage Vital Signs ED Triage Vitals  Encounter Vitals Group     BP 11/30/22 1158 127/84  Systolic BP Percentile --      Diastolic BP Percentile --      Pulse Rate 11/30/22 1158 (!) 59     Resp 11/30/22 1158 16     Temp 11/30/22 1158 97.7 F (36.5 C)     Temp Source 11/30/22 1158 Oral     SpO2 11/30/22 1158 98 %     Weight --      Height --      Head Circumference --      Peak Flow --      Pain Score 11/30/22 1159 0     Pain Loc --      Pain Education --      Exclude from Growth Chart --    No data found.  Updated Vital Signs BP 127/84 (BP Location: Left Arm)   Pulse (!) 59   Temp 97.7 F (36.5 C) (Oral)   Resp 16   SpO2 98%   Visual Acuity Right Eye Distance:   Left Eye Distance:   Bilateral Distance:    Right Eye Near:    Left Eye Near:    Bilateral Near:     Physical Exam Constitutional:      General: She is not in acute distress.    Appearance: Normal appearance. She is not toxic-appearing or diaphoretic.  HENT:     Head: Normocephalic and atraumatic.  Eyes:     Extraocular Movements: Extraocular movements intact.     Conjunctiva/sclera: Conjunctivae normal.  Pulmonary:     Effort: Pulmonary effort is normal.  Neurological:     General: No focal deficit present.     Mental Status: She is alert and oriented to person, place, and time. Mental status is at baseline.  Psychiatric:        Mood and Affect: Mood normal.        Behavior: Behavior normal.        Thought Content: Thought content normal.        Judgment: Judgment normal.      UC Treatments / Results  Labs (all labs ordered are listed, but only abnormal results are displayed) Labs Reviewed  POCT URINALYSIS DIP (MANUAL ENTRY) - Abnormal; Notable for the following components:      Result Value   Clarity, UA cloudy (*)    Blood, UA moderate (*)    Urobilinogen, UA 0.2 (*)    Leukocytes, UA Moderate (2+) (*)    All other components within normal limits  URINE CULTURE    EKG   Radiology No results found.  Procedures Procedures (including critical care time)  Medications Ordered in UC Medications - No data to display  Initial Impression / Assessment and Plan / UC Course  I have reviewed the triage vital signs and the nursing notes.  Pertinent labs & imaging results that were available during my care of the patient were reviewed by me and considered in my medical decision making (see chart for details).     UA indicating UTI with moderate leukocytes.  Will send urine culture to confirm.  Cipro prescribed to treat UTI as patient reports that this is typically the most helpful antibiotic for her.  She has most recent urine culture results on her phone that indicates that bacteria is sensitive to Cipro so this should be  appropriate. Can not see these results in chart.  Most recent crcl that is visible to me is 82 so no dosage adjustment necessary. Advised to follow-up if any symptoms persist or worsen.  Patient verbalized understanding and was agreeable with plan. Final Clinical Impressions(s) / UC Diagnoses   Final diagnoses:  Acute cystitis with hematuria  Dysuria     Discharge Instructions      I have prescribed you Cipro antibiotic for concern for UTI.  Do not do any strenuous physical activity or exercise while taking this antibiotic.  Urine culture is pending.  Will call with results.    ED Prescriptions     Medication Sig Dispense Auth. Provider   ciprofloxacin (CIPRO) 500 MG tablet Take 1 tablet (500 mg total) by mouth every 12 (twelve) hours. 10 tablet Gustavus Bryant, [place]      PDMP not reviewed this encounter.   Gustavus Bryant, [place] 11/30/22 1224

## 2022-11-30 NOTE — Discharge Instructions (Signed)
I have prescribed you Cipro antibiotic for concern for UTI.  Do not do any strenuous physical activity or exercise while taking this antibiotic.  Urine culture is pending.  Will call with results.

## 2022-12-25 ENCOUNTER — Ambulatory Visit
Admission: EM | Admit: 2022-12-25 | Discharge: 2022-12-25 | Disposition: A | Discharge status: Home / Self Care | Payer: Medicare Other | Attending: Internal Medicine | Admitting: Internal Medicine

## 2022-12-25 DIAGNOSIS — S90822A Blister (nonthermal), left foot, initial encounter: Secondary | ICD-10-CM

## 2022-12-25 MED ORDER — CEPHALEXIN 500 MG PO CAPS: 500.0000 mg | ORAL_CAPSULE | Freq: Three times a day (TID) | ORAL | 0 refills | Status: AC

## 2022-12-25 NOTE — ED Triage Notes (Signed)
Pt reports she has a blister in the left foot x 4 days. Reports is getting bigger.

## 2022-12-25 NOTE — Discharge Instructions (Signed)
I have prescribed antibiotic to cover for any infection associated with this.  Monitor closely for any worsening or persistent symptoms and follow-up with primary care or urgent care if this occurs.

## 2022-12-25 NOTE — ED Provider Notes (Signed)
[facility]    CSN: [id number] Arrival date & time: 12/25/22  0813      History   Chief Complaint Chief Complaint  Patient presents with   Abscess    HPI Christina Carey is a 67 y.o. female.   Patient presents with blister to top of left foot that she noticed about 5 to 6 days ago.  Denies any obvious injury to the area.  Reports that she did have a sunburn in the area of her foot at the end of July/beginning of August but it improved prior to blister starting.  Reports that she does not have diabetes.  Denies any obvious pain to the area unless her shoe rubs it.  Reports that she is concerned given the blister has enlarged over the past few days.  Denies any fever or drainage of the blister.   Abscess   Past Medical History:  Diagnosis Date   CKD stage 2 due to type 2 diabetes mellitus (HCC)    Colitis 06/2015   admitted for suspected ischemic colitis   DM2 (diabetes mellitus, type 2) (HCC)    GERD (gastroesophageal reflux disease)    High blood pressure    Hyperlipemia    Hypertension, renal 2016   [name]   MS (multiple sclerosis) [facility])    Thyroid disorder     Patient Active Problem List   Diagnosis Date Noted   Urinary urgency 08/03/2021   Gait disturbance 08/03/2021   Ischemic colitis (HCC) 01/02/2018   IBS (irritable bowel syndrome) 05/02/2017   Fecal incontinence 05/02/2017   Colitis 06/27/2015   Encounter for therapeutic drug monitoring 01/05/2015   MULTIPLE SCLEROSIS 05/26/2008   HYPERTENSION 05/26/2008   HIATAL HERNIA 05/26/2008   CONSTIPATION, CHRONIC 05/26/2008   HEMOCCULT POSITIVE STOOL 05/26/2008   CHEST PAIN 05/26/2008   ABDOMINAL PAIN, UNSPECIFIED SITE 05/26/2008    Past Surgical History:  Procedure Laterality Date   COLONOSCOPY     WISDOM TOOTH EXTRACTION      OB History   No obstetric history on file.      Home Medications    Prior to Admission medications   Medication Sig Start Date End Date Taking?  Authorizing Provider  cephALEXin (KEFLEX) 500 MG capsule Take 1 capsule (500 mg total) by mouth 3 (three) times daily for 5 days. 12/25/22 12/30/22 Yes , Acie Fredrickson, FNP  Calcium Carbonate-Vitamin D (CALCIUM + D PO) Take 1,000 mg by mouth daily.     [provider]  CINNAMON PO Take 1,000 mg by mouth 2 (two) times daily.    [provider]  ciprofloxacin (CIPRO) 500 MG tablet Take 1 tablet (500 mg total) by mouth every 12 (twelve) hours. 11/30/22   Gustavus Bryant, FNP  ESTRACE VAGINAL 0.1 MG/GM vaginal cream 1 Applicatorful as needed.  06/20/15   [provider]  levothyroxine (SYNTHROID, LEVOTHROID) 88 MCG tablet Take 88 mcg by mouth daily. 04/06/17   [provider]  Methylcellulose, Laxative, (CITRUCEL) 500 MG TABS Take 2 tablets by mouth daily.    [provider]  valsartan-hydrochlorothiazide (DIOVAN-HCT) 160-12.5 MG tablet 1 tablet 08/11/20   [provider]    Family History Family History  Problem Relation Age of Onset   Ovarian cancer Mother    Lung cancer Father    Cancer Maternal Grandmother    Colon cancer Neg Hx     Social History Social History   Tobacco Use   Smoking status: Never   Smokeless tobacco: Never  Vaping Use   Vaping status: Never Used  Substance Use Topics   Alcohol use: No    Comment: caffeine drinks 1-2 daily   Drug use: No     Allergies   Amlodipine and Sulfamethoxazole-trimethoprim   Review of Systems Review of Systems Per HPI  Physical Exam Triage Vital Signs ED Triage Vitals  Encounter Vitals Group     BP 12/25/22 0906 139/85     Systolic BP Percentile --      Diastolic BP Percentile --      Pulse Rate 12/25/22 0906 70     Resp 12/25/22 0906 16     Temp 12/25/22 0906 97.8 F (36.6 C)     Temp Source 12/25/22 0906 Oral     SpO2 12/25/22 0906 97 %     Weight --      Height --      Head Circumference --      Peak Flow --      Pain Score 12/25/22 0910 3     Pain Loc --      Pain  Education --      Exclude from Growth Chart --    No data found.  Updated Vital Signs BP 139/85 (BP Location: Right Arm)   Pulse 70   Temp 97.8 F (36.6 C) (Oral)   Resp 16   SpO2 97%   Visual Acuity Right Eye Distance:   Left Eye Distance:   Bilateral Distance:    Right Eye Near:   Left Eye Near:    Bilateral Near:     Physical Exam Constitutional:      General: She is not in acute distress.    Appearance: Normal appearance. She is not toxic-appearing or diaphoretic.  HENT:     Head: Normocephalic and atraumatic.  Eyes:     Extraocular Movements: Extraocular movements intact.     Conjunctiva/sclera: Conjunctivae normal.  Pulmonary:     Effort: Pulmonary effort is normal.  Musculoskeletal:       Feet:  Feet:     Comments: Patient has approximately 1 inch in diameter intact vesicular blister present to top of left foot in between great and second toe.  No obvious swelling or erythema surrounding.  Patient can wiggle toes and is neurovascularly intact. Neurological:     General: No focal deficit present.     Mental Status: She is alert and oriented to person, place, and time. Mental status is at baseline.  Psychiatric:        Mood and Affect: Mood normal.        Behavior: Behavior normal.        Thought Content: Thought content normal.        Judgment: Judgment normal.      UC Treatments / Results  Labs (all labs ordered are listed, but only abnormal results are displayed) Labs Reviewed - No data to display  EKG   Radiology No results found.  Procedures Procedures (including critical care time)  Medications Ordered in UC Medications - No data to display  Initial Impression / Assessment and Plan / UC Course  I have reviewed the triage vital signs and the nursing notes.  Pertinent labs & imaging results that were available during my care of the patient were reviewed by me and considered in my medical decision making (see chart for details).      Patient has blister to dorsal surface of left foot.  It is intact so will leave intact to prevent  any additional infection.  No obvious signs of infection or inflammation on exam that would warrant emergent evaluation or imaging of the foot.  Although, will opt to treat with cephalexin antibiotic to cover for any bacterial infection given the blister is enlarging.  Advised patient to leave blister intact unless it bursts on its own.  Advised to monitor closely and follow-up if blister persists or worsens.  Patient verbalized understanding and was agreeable with plan. Final Clinical Impressions(s) / UC Diagnoses   Final diagnoses:  Blister of left foot, initial encounter     Discharge Instructions      I have prescribed antibiotic to cover for any infection associated with this.  Monitor closely for any worsening or persistent symptoms and follow-up with primary care or urgent care if this occurs.    ED Prescriptions     Medication Sig Dispense Auth. Provider   cephALEXin (KEFLEX) 500 MG capsule Take 1 capsule (500 mg total) by mouth 3 (three) times daily for 5 days. 15 capsule [place], Acie Fredrickson, [place]      PDMP not reviewed this encounter.   Gustavus Bryant, [place] 12/25/22 [phone]

## 2022-12-28 DIAGNOSIS — E039 Hypothyroidism, unspecified: Secondary | ICD-10-CM | POA: Diagnosis not present

## 2022-12-28 DIAGNOSIS — S90822A Blister (nonthermal), left foot, initial encounter: Secondary | ICD-10-CM | POA: Diagnosis not present

## 2022-12-28 DIAGNOSIS — R7309 Other abnormal glucose: Secondary | ICD-10-CM | POA: Diagnosis not present

## 2022-12-28 DIAGNOSIS — M8589 Other specified disorders of bone density and structure, multiple sites: Secondary | ICD-10-CM | POA: Diagnosis not present

## 2022-12-28 DIAGNOSIS — M810 Age-related osteoporosis without current pathological fracture: Secondary | ICD-10-CM | POA: Diagnosis not present

## 2022-12-28 DIAGNOSIS — Z79899 Other long term (current) drug therapy: Secondary | ICD-10-CM | POA: Diagnosis not present

## 2022-12-28 DIAGNOSIS — E7800 Pure hypercholesterolemia, unspecified: Secondary | ICD-10-CM | POA: Diagnosis not present

## 2023-01-03 DIAGNOSIS — K080 Exfoliation of teeth due to systemic causes: Secondary | ICD-10-CM | POA: Diagnosis not present

## 2023-01-04 DIAGNOSIS — E039 Hypothyroidism, unspecified: Secondary | ICD-10-CM | POA: Diagnosis not present

## 2023-01-04 DIAGNOSIS — G35 Multiple sclerosis: Secondary | ICD-10-CM | POA: Diagnosis not present

## 2023-01-04 DIAGNOSIS — E7800 Pure hypercholesterolemia, unspecified: Secondary | ICD-10-CM | POA: Diagnosis not present

## 2023-01-04 DIAGNOSIS — Z0000 Encounter for general adult medical examination without abnormal findings: Secondary | ICD-10-CM | POA: Diagnosis not present

## 2023-02-16 DIAGNOSIS — J019 Acute sinusitis, unspecified: Secondary | ICD-10-CM | POA: Diagnosis not present

## 2023-04-09 DIAGNOSIS — K080 Exfoliation of teeth due to systemic causes: Secondary | ICD-10-CM | POA: Diagnosis not present

## 2023-06-24 ENCOUNTER — Other Ambulatory Visit: Payer: Self-pay

## 2023-06-24 ENCOUNTER — Ambulatory Visit
Admission: EM | Admit: 2023-06-24 | Discharge: 2023-06-24 | Disposition: A | Discharge status: Home / Self Care | Payer: Medicare Other | Attending: Family Medicine | Admitting: Family Medicine

## 2023-06-24 DIAGNOSIS — N390 Urinary tract infection, site not specified: Secondary | ICD-10-CM | POA: Insufficient documentation

## 2023-06-24 DIAGNOSIS — R350 Frequency of micturition: Secondary | ICD-10-CM | POA: Diagnosis not present

## 2023-06-24 LAB — POCT URINALYSIS DIP (MANUAL ENTRY)
Bilirubin, UA: NEGATIVE
Glucose, UA: NEGATIVE mg/dL
Ketones, POC UA: NEGATIVE mg/dL
Nitrite, UA: NEGATIVE
Protein Ur, POC: >=300 mg/dL — AB
Spec Grav, UA: 1.025 (ref 1.010–1.025)
Urobilinogen, UA: 0.2 U/dL
pH, UA: 6 (ref 5.0–8.0)

## 2023-06-24 MED ORDER — CEPHALEXIN 500 MG PO CAPS
500.0000 mg | ORAL_CAPSULE | Freq: Two times a day (BID) | ORAL | 0 refills | Status: AC
Start: 2023-06-24 — End: 2023-06-29

## 2023-06-24 NOTE — Discharge Instructions (Signed)
 Go to the emergency department if you did fever, flank pain, vomiting, severe symptoms or concerns

## 2023-06-24 NOTE — ED Triage Notes (Signed)
 Pt presents to urgent care with urinary frequency and bladder spasms that began approximately 4 hours PTA. Pt currently rates her overall pain a 3/10, voices more of a discomfort. Denies taking OTC medications for symptoms.

## 2023-06-24 NOTE — ED Provider Notes (Signed)
 [facility]    CSN: [id number] Arrival date & time: 06/24/23  1255      History   Chief Complaint Chief Complaint  Patient presents with   Urinary Frequency    HPI Christina Carey is a 68 y.o. female.   The history is provided by the patient.  Urinary Frequency  Suspected urinary tract infection symptom onset this morning symptoms include frequency, urgency, dysuria, bladder pressure and spasms.  Has had UTIs in the past last one about 6 months ago.  Admits cloudy urine.  Denies hematuria, fever, chills,, nausea, vomiting, diarrhea, back pain, vaginal discharge, rashes or skin changes, dizziness or lightheadedness.  Past Medical History:  Diagnosis Date   CKD stage 2 due to type 2 diabetes mellitus (HCC)    Colitis 06/2015   admitted for suspected ischemic colitis   DM2 (diabetes mellitus, type 2) (HCC)    GERD (gastroesophageal reflux disease)    High blood pressure    Hyperlipemia    Hypertension, renal 2016   Christina Brunette   MS (multiple sclerosis) [facility])    Thyroid disorder     Patient Active Problem List   Diagnosis Date Noted   Urinary urgency 08/03/2021   Gait disturbance 08/03/2021   Ischemic colitis (HCC) 01/02/2018   IBS (irritable bowel syndrome) 05/02/2017   Fecal incontinence 05/02/2017   Colitis 06/27/2015   Encounter for therapeutic drug monitoring 01/05/2015   MULTIPLE SCLEROSIS 05/26/2008   HYPERTENSION 05/26/2008   HIATAL HERNIA 05/26/2008   CONSTIPATION, CHRONIC 05/26/2008   HEMOCCULT POSITIVE STOOL 05/26/2008   CHEST PAIN 05/26/2008   ABDOMINAL PAIN, UNSPECIFIED SITE 05/26/2008    Past Surgical History:  Procedure Laterality Date   COLONOSCOPY     WISDOM TOOTH EXTRACTION      OB History   No obstetric history on file.      Home Medications    Prior to Admission medications   Medication Sig Start Date End Date Taking? Authorizing Provider  cephALEXin (KEFLEX) 500 MG capsule Take 1 capsule (500 mg total) by mouth  2 (two) times daily for 5 days. 06/24/23 06/29/23 Yes Meliton Rattan, PA  Calcium Carbonate-Vitamin D (CALCIUM + D PO) Take 1,000 mg by mouth daily.     [provider]  CINNAMON PO Take 1,000 mg by mouth 2 (two) times daily.    [provider]  ciprofloxacin (CIPRO) 500 MG tablet Take 1 tablet (500 mg total) by mouth every 12 (twelve) hours. 11/30/22   Gustavus Bryant, FNP  ESTRACE VAGINAL 0.1 MG/GM vaginal cream 1 Applicatorful as needed.  06/20/15   [provider]  levothyroxine (SYNTHROID, LEVOTHROID) 88 MCG tablet Take 88 mcg by mouth daily. 04/06/17   [provider]  Methylcellulose, Laxative, (CITRUCEL) 500 MG TABS Take 2 tablets by mouth daily.    [provider]  valsartan-hydrochlorothiazide (DIOVAN-HCT) 160-12.5 MG tablet 1 tablet 08/11/20   [provider]    Family History Family History  Problem Relation Age of Onset   Ovarian cancer Mother    Lung cancer Father    Cancer Maternal Grandmother    Colon cancer Neg Hx     Social History Social History   Tobacco Use   Smoking status: Never   Smokeless tobacco: Never  Vaping Use   Vaping status: Never Used  Substance Use Topics   Alcohol use: No    Comment: caffeine drinks 1-2 daily   Drug use: No     Allergies   Amlodipine and  Sulfamethoxazole-trimethoprim   Review of Systems Review of Systems  Genitourinary:  Positive for frequency.     Physical Exam Triage Vital Signs ED Triage Vitals  Encounter Vitals Group     BP 06/24/23 1307 126/73     Systolic BP Percentile --      Diastolic BP Percentile --      Pulse Rate 06/24/23 1307 76     Resp 06/24/23 1307 18     Temp 06/24/23 1307 97.6 F (36.4 C)     Temp Source 06/24/23 1307 Oral     SpO2 06/24/23 1307 96 %     Weight 06/24/23 1305 180 lb (81.6 kg)     Height 06/24/23 1305 5\' 7"  (1.702 m)     Head Circumference --      Peak Flow --      Pain Score 06/24/23 1305 3     Pain Loc --      Pain  Education --      Exclude from Growth Chart --    No data found.  Updated Vital Signs BP 126/73 (BP Location: Right Arm)   Pulse 76   Temp 97.6 F (36.4 C) (Oral)   Resp 18   Ht 5\' 7"  (1.702 m)   Wt 180 lb (81.6 kg)   SpO2 96%   BMI 28.19 kg/m   Visual Acuity Right Eye Distance:   Left Eye Distance:   Bilateral Distance:    Right Eye Near:   Left Eye Near:    Bilateral Near:     Physical Exam Vitals and nursing note reviewed.  Constitutional:      Appearance: She is not ill-appearing.  HENT:     Head: Normocephalic.     Right Ear: Tympanic membrane and ear canal normal.     Left Ear: Tympanic membrane and ear canal normal.     Nose: No rhinorrhea.     Mouth/Throat:     Mouth: Mucous membranes are moist.  Eyes:     Conjunctiva/sclera: Conjunctivae normal.  Cardiovascular:     Rate and Rhythm: Normal rate and regular rhythm.     Heart sounds: Normal heart sounds.  Pulmonary:     Effort: Pulmonary effort is normal. No respiratory distress.     Breath sounds: Normal breath sounds. No wheezing or rales.  Abdominal:     Palpations: Abdomen is soft.     Tenderness: There is no abdominal tenderness. There is no right CVA tenderness, left CVA tenderness or guarding.  Musculoskeletal:     Cervical back: Neck supple.  Lymphadenopathy:     Cervical: No cervical adenopathy.  Skin:    General: Skin is warm and dry.  Neurological:     Mental Status: She is alert and oriented to person, place, and time.      UC Treatments / Results  Labs (all labs ordered are listed, but only abnormal results are displayed) Labs Reviewed  POCT URINALYSIS DIP (MANUAL ENTRY) - Abnormal; Notable for the following components:      Result Value   Clarity, UA turbid (*)    Blood, UA large (*)    Protein Ur, POC >=300 (*)    Leukocytes, UA Small (1+) (*)    All other components within normal limits  URINE CULTURE    EKG   Radiology No results found.  Procedures Procedures  (including critical care time)  Medications Ordered in UC Medications - No data to display  Initial Impression / Assessment and Plan /  UC Course  I have reviewed the triage vital signs and the nursing notes.  Pertinent labs & imaging results that were available during my care of the patient were reviewed by me and considered in my medical decision making (see chart for details).     68 year old female with symptoms of UTI onset today.  She is well-appearing, vital signs are stable, abdominal exam benign no CVA tenderness, she is afebrile.  Point-of-care urinalysis urine is turbid with large RBCs greater than 300 protein and small leuks.  Chart reviewed her last culture grew Citrobacter Youngae susceptible to all antibiotics tested.  She had a UTI 6 months ago treated with Cipro, she is allergic to sulfa we will use Keflex today pending urine culture results.  Warning signs and follow-up reviewed with patient Final Clinical Impressions(s) / UC Diagnoses   Final diagnoses:  Acute UTI     Discharge Instructions      Go to the emergency department if you did fever, flank pain, vomiting, severe symptoms or concerns   ED Prescriptions     Medication Sig Dispense Auth. Provider   cephALEXin (KEFLEX) 500 MG capsule Take 1 capsule (500 mg total) by mouth 2 (two) times daily for 5 days. 10 capsule Meliton Rattan, PA      PDMP not reviewed this encounter.   Meliton Rattan, [place] 06/24/23 1339

## 2023-06-25 DIAGNOSIS — K080 Exfoliation of teeth due to systemic causes: Secondary | ICD-10-CM | POA: Diagnosis not present

## 2023-06-26 LAB — URINE CULTURE: Culture: >=100000 — AB

## 2023-07-08 DIAGNOSIS — K0850 Unsatisfactory restoration of tooth, unspecified: Secondary | ICD-10-CM | POA: Diagnosis not present

## 2023-07-11 DIAGNOSIS — H40013 Open angle with borderline findings, low risk, bilateral: Secondary | ICD-10-CM | POA: Diagnosis not present

## 2023-07-11 DIAGNOSIS — R7309 Other abnormal glucose: Secondary | ICD-10-CM | POA: Diagnosis not present

## 2023-07-11 DIAGNOSIS — H25813 Combined forms of age-related cataract, bilateral: Secondary | ICD-10-CM | POA: Diagnosis not present

## 2023-07-11 LAB — HM DIABETES EYE EXAM

## 2023-07-22 ENCOUNTER — Other Ambulatory Visit: Payer: Self-pay

## 2023-07-22 ENCOUNTER — Ambulatory Visit
Admission: EM | Admit: 2023-07-22 | Discharge: 2023-07-22 | Disposition: A | Discharge status: Home / Self Care | Attending: Family Medicine | Admitting: Family Medicine

## 2023-07-22 DIAGNOSIS — N390 Urinary tract infection, site not specified: Secondary | ICD-10-CM | POA: Diagnosis not present

## 2023-07-22 DIAGNOSIS — B9689 Other specified bacterial agents as the cause of diseases classified elsewhere: Secondary | ICD-10-CM | POA: Diagnosis not present

## 2023-07-22 LAB — POCT URINALYSIS DIP (MANUAL ENTRY)
Bilirubin, UA: NEGATIVE
Glucose, UA: NEGATIVE mg/dL
Ketones, POC UA: NEGATIVE mg/dL
Nitrite, UA: POSITIVE — AB
Protein Ur, POC: NEGATIVE mg/dL
Spec Grav, UA: 1.01 (ref 1.010–1.025)
Urobilinogen, UA: 0.2 U/dL
pH, UA: 6 (ref 5.0–8.0)

## 2023-07-22 MED ORDER — CEFDINIR 300 MG PO CAPS: 300.0000 mg | ORAL_CAPSULE | Freq: Two times a day (BID) | ORAL | 0 refills | Status: AC

## 2023-07-22 MED ORDER — PHENAZOPYRIDINE HCL 200 MG PO TABS: 200.0000 mg | ORAL_TABLET | Freq: Three times a day (TID) | ORAL | 0 refills | Status: AC

## 2023-07-22 NOTE — ED Triage Notes (Addendum)
 Pt presents to urgent care with urinary frequency and bladder spasms x 1 month. Pt states she has followed up with primary care two times and has appointment scheduled with urology April 3rd. Pt currently rates her overall pain a 4/10. Pt states she was trying to withstand the pain but it has become unbearable at times. Denies fevers.

## 2023-07-22 NOTE — ED Provider Notes (Signed)
 [facility]    CSN: [id number] Arrival date & time: 07/22/23  0816      History   Chief Complaint Chief Complaint  Patient presents with   Urinary Frequency    HPI Christina Carey is a 68 y.o. female.   The history is provided by the patient.  Urinary Frequency  Suspected urinary tract infection, this has not been a recurrent issue for the last month, has completed 2 courses of cephalexin states gets relief and several days later will develop new symptoms.  Symptoms include dysuria, frequency, malodorous urine.  She has an issue with stool incontinence admits lower abdominal pressure.  Denies fever, chills, back pain, nausea, vomiting, hematuria, change in appetite, vaginal discharge, vaginal itching or burning, genital rash.  She has had several communications with her PCP and is scheduled to see urology if preferred.  Allergy to sulfa  Past Medical History:  Diagnosis Date   CKD stage 2 due to type 2 diabetes mellitus (HCC)    Colitis 06/2015   admitted for suspected ischemic colitis   DM2 (diabetes mellitus, type 2) (HCC)    GERD (gastroesophageal reflux disease)    High blood pressure    Hyperlipemia    Hypertension, renal 2016   [name]   MS (multiple sclerosis) [facility])    Thyroid disorder     Patient Active Problem List   Diagnosis Date Noted   Urinary urgency 08/03/2021   Gait disturbance 08/03/2021   Ischemic colitis (HCC) 01/02/2018   IBS (irritable bowel syndrome) 05/02/2017   Fecal incontinence 05/02/2017   Colitis 06/27/2015   Encounter for therapeutic drug monitoring 01/05/2015   MULTIPLE SCLEROSIS 05/26/2008   HYPERTENSION 05/26/2008   HIATAL HERNIA 05/26/2008   CONSTIPATION, CHRONIC 05/26/2008   HEMOCCULT POSITIVE STOOL 05/26/2008   CHEST PAIN 05/26/2008   ABDOMINAL PAIN, UNSPECIFIED SITE 05/26/2008    Past Surgical History:  Procedure Laterality Date   COLONOSCOPY     WISDOM TOOTH EXTRACTION      OB History   No  obstetric history on file.      Home Medications    Prior to Admission medications   Medication Sig Start Date End Date Taking? Authorizing Provider  cefdinir (OMNICEF) 300 MG capsule Take 1 capsule (300 mg total) by mouth 2 (two) times daily for 5 days. 07/22/23 07/27/23 Yes Meliton Rattan, PA  phenazopyridine (PYRIDIUM) 200 MG tablet Take 1 tablet (200 mg total) by mouth 3 (three) times daily. 07/22/23  Yes Meliton Rattan, PA  Calcium Carbonate-Vitamin D (CALCIUM + D PO) Take 1,000 mg by mouth daily.     [provider]  CINNAMON PO Take 1,000 mg by mouth 2 (two) times daily.    [provider]  ESTRACE VAGINAL 0.1 MG/GM vaginal cream 1 Applicatorful as needed.  06/20/15   [provider]  levothyroxine (SYNTHROID, LEVOTHROID) 88 MCG tablet Take 88 mcg by mouth daily. 04/06/17   [provider]  Methylcellulose, Laxative, (CITRUCEL) 500 MG TABS Take 2 tablets by mouth daily.    [provider]  valsartan-hydrochlorothiazide (DIOVAN-HCT) 160-12.5 MG tablet 1 tablet 08/11/20   [provider]    Family History Family History  Problem Relation Age of Onset   Ovarian cancer Mother    Lung cancer Father    Cancer Maternal Grandmother    Colon cancer Neg Hx     Social History Social History   Tobacco Use   Smoking status: Never   Smokeless tobacco: Never  Vaping  Use   Vaping status: Never Used  Substance Use Topics   Alcohol use: No    Comment: caffeine drinks 1-2 daily   Drug use: No     Allergies   Amlodipine and Sulfamethoxazole-trimethoprim   Review of Systems Review of Systems  Genitourinary:  Positive for frequency.     Physical Exam Triage Vital Signs ED Triage Vitals  Encounter Vitals Group     BP 07/22/23 0825 (!) 144/87     Systolic BP Percentile --      Diastolic BP Percentile --      Pulse Rate 07/22/23 0825 64     Resp 07/22/23 0825 18     Temp 07/22/23 0825 97.7 F (36.5 C)     Temp Source  07/22/23 0825 Oral     SpO2 07/22/23 0825 97 %     Weight 07/22/23 0823 180 lb (81.6 kg)     Height 07/22/23 0823 5\' 7"  (1.702 m)     Head Circumference --      Peak Flow --      Pain Score 07/22/23 0823 4     Pain Loc --      Pain Education --      Exclude from Growth Chart --    No data found.  Updated Vital Signs BP (!) 144/87 (BP Location: Right Arm)   Pulse 64   Temp 97.7 F (36.5 C) (Oral)   Resp 18   Ht 5\' 7"  (1.702 m)   Wt 180 lb (81.6 kg)   SpO2 97%   BMI 28.19 kg/m   Visual Acuity Right Eye Distance:   Left Eye Distance:   Bilateral Distance:    Right Eye Near:   Left Eye Near:    Bilateral Near:     Physical Exam Vitals and nursing note reviewed.  Constitutional:      Appearance: She is not ill-appearing.  HENT:     Head: Normocephalic and atraumatic.     Mouth/Throat:     Mouth: Mucous membranes are moist.  Eyes:     Conjunctiva/sclera: Conjunctivae normal.  Cardiovascular:     Rate and Rhythm: Normal rate and regular rhythm.     Heart sounds: Normal heart sounds.  Pulmonary:     Effort: Pulmonary effort is normal.     Breath sounds: Normal breath sounds.  Abdominal:     General: Bowel sounds are normal. There is no distension.     Palpations: Abdomen is soft.     Tenderness: There is no abdominal tenderness. There is no right CVA tenderness or guarding.  Musculoskeletal:     Cervical back: Neck supple.  Neurological:     Mental Status: She is alert.      UC Treatments / Results  Labs (all labs ordered are listed, but only abnormal results are displayed) Labs Reviewed  POCT URINALYSIS DIP (MANUAL ENTRY) - Abnormal; Notable for the following components:      Result Value   Clarity, UA turbid (*)    Blood, UA small (*)    Nitrite, UA Positive (*)    Leukocytes, UA Large (3+) (*)    All other components within normal limits  URINE CULTURE    EKG   Radiology No results found.  Procedures Procedures (including critical care  time)  Medications Ordered in UC Medications - No data to display  Initial Impression / Assessment and Plan / UC Course  I have reviewed the triage vital signs and the nursing notes.  Pertinent labs & imaging results that were available during my care of the patient were reviewed by me and considered in my medical decision making (see chart for details).    68 year old female with recurrent UTIs, last culture grew E. coli susceptible to all antibiotics tested.  Point-of-care urine dip consistent with UTI with nitrates leukocytes small RBCs and turbid color.  Will start with cefdinir pending urine culture.  Warning signs and follow-up reviewed with patient continue with current plan to see urology Final Clinical Impressions(s) / UC Diagnoses   Final diagnoses:  Urinary tract infection without hematuria, site unspecified   Discharge Instructions   None    ED Prescriptions     Medication Sig Dispense Auth. Provider   cefdinir (OMNICEF) 300 MG capsule Take 1 capsule (300 mg total) by mouth 2 (two) times daily for 5 days. 10 capsule Meliton Rattan, PA   phenazopyridine (PYRIDIUM) 200 MG tablet Take 1 tablet (200 mg total) by mouth 3 (three) times daily. 6 tablet Meliton Rattan, [place]      PDMP not reviewed this encounter.   Meliton Rattan, [place] 07/22/23 [phone]

## 2023-07-24 LAB — URINE CULTURE: Culture: >=100000 — AB

## 2023-08-08 DIAGNOSIS — R350 Frequency of micturition: Secondary | ICD-10-CM | POA: Diagnosis not present

## 2023-08-14 ENCOUNTER — Encounter: Payer: Self-pay | Admitting: Neurology

## 2023-08-14 ENCOUNTER — Ambulatory Visit: Payer: Medicare Other | Admitting: Neurology

## 2023-08-14 VITALS — BP 120/67 | HR 61 | Ht 67.0 in | Wt 189.0 lb

## 2023-08-14 DIAGNOSIS — R269 Unspecified abnormalities of gait and mobility: Secondary | ICD-10-CM

## 2023-08-14 DIAGNOSIS — G35 Multiple sclerosis: Secondary | ICD-10-CM | POA: Diagnosis not present

## 2023-08-14 DIAGNOSIS — Z79899 Other long term (current) drug therapy: Secondary | ICD-10-CM

## 2023-08-14 DIAGNOSIS — G35D Multiple sclerosis, unspecified: Secondary | ICD-10-CM

## 2023-08-14 DIAGNOSIS — R3915 Urgency of urination: Secondary | ICD-10-CM | POA: Diagnosis not present

## 2023-08-14 NOTE — Progress Notes (Signed)
 [facility]  PATIENT: Christina Carey DOB: [birth year]  REFERRING DOCTOR OR PCP: Christina Reichmann, DO SOURCE: Patient, notes from Dr. Anne Carey and Christina Dapper, NP, imaging and lab reports, MRI images personally reviewed.  _________________________________   HISTORICAL  CHIEF COMPLAINT:  Chief Complaint  Patient presents with   Follow-up    Pt in 10 alone Pt here for MS f/u Pt states no questions or concerns for today's visit     HISTORY OF PRESENT ILLNESS:   Christina Carey is a 68 y.o. woman with Multipe sclerosis  Update 08/14/2023: She was diagnosed with MS in 1996.  She has minimal impairments and last exacerbation was close to time of diagnosis..  Gait and balance are mostly fine.    No recent falls.  She sometimes has right foot numbness/tingling, usually if laying down..  She can walk >1 mile without difficulty though may not keep up with others for long walk (keeps up with sister).   She uses bannister on stairs.   She denies weakness.   She uses a stand up desk at work  She has urinary urgency but no major incontinence but a little dribbling a few times.  She has had Bladder scans for PVR and has not been told she has an issue.  She has frequent UTI and sees urology.  Vision is fine.  She has mild fatigue.  She had lost 40 pounds but put 10 pounds back on, 5 since last year.   Sometimes she is sleepy.   She wakes up 2-3 times a night for the bathroom.    She rarely falls asleep watching TV and takes a nap on Sundays but otherwise stays awake during the day.   She snores less after the weight loss.   She denies mood issues.  No major cognitive issues but she has some mild word finding difficulty      She works in Audiological scientist.  MS History She was diagnosed with MS in 1996 after presenting with left leg > arm weakness.   She was concerned about a stroke and saw her PCP who ordered tha MRI that was found to be cw MS.  She had an LP to confirm the diagnosis.  She  was placed on Betaseron.   She did well on the Betaseron and switched to Avonex due to injection site reactions and ischemic colitis.   She switced to Tecfidera around 2013 or 2014 but had GI issues and switched to Gilenya.    She stopped Gilenya in 2023.  IMAGING MRI of the brain 08/01/2021 showed some scattered T2/FLAIR hyperintense foci in the periventricular, juxtacortical and deep white matter of the hemispheres and possibly an additional punctate focus in the pons.  These are consistent with chronic demyelinating plaque associated with multiple sclerosis.  None of the foci enhanced after contrast.  Compared to the MRI from 01/07/2019, there are no new lesions.  The overall plaque burden is mild  MRI 08/30/2022 Scattered T2/FLAIR hyperintense foci in the cerebral hemispheres and one focus in the pons in a pattern consistent with chronic demyelinating plaque associated with multiple sclerosis. Some of the foci could also represent chronic microvascular ischemic change. Compared to the MRI from 08/01/2021, there are no new lesions.   REVIEW OF SYSTEMS: Constitutional: No fevers, chills, sweats, or change in appetite Eyes: No visual changes, double vision, eye pain Ear, nose and throat: No hearing loss, ear pain, nasal congestion, sore throat Cardiovascular: No chest pain, palpitations Respiratory:  No  shortness of breath at rest or with exertion.   No wheezes GastrointestinaI: No nausea, vomiting, diarrhea, abdominal pain, fecal incontinence Genitourinary:  No dysuria, urinary retention or frequency.  No nocturia. Musculoskeletal:  No neck pain, back pain Integumentary: No rash, pruritus, skin lesions Neurological: as above Psychiatric: No depression at this time.  No anxiety Endocrine: No palpitations, diaphoresis, change in appetite, change in weigh or increased thirst Hematologic/Lymphatic:  No anemia, purpura, petechiae. Allergic/Immunologic: No itchy/runny eyes, nasal congestion, recent  allergic reactions, rashes  ALLERGIES: Allergies  Allergen Reactions   Amlodipine     Other Reaction(s): leg swelling   Sulfamethoxazole-Trimethoprim     REACTION: rash, taking trimethoprim now and is not having a problem. 01-05-15.    HOME MEDICATIONS:  Current Outpatient Medications:    Calcium Carbonate-Vitamin D (CALCIUM + D PO), Take 1,000 mg by mouth daily. , Disp: , Rfl:    CINNAMON PO, Take 1,000 mg by mouth 2 (two) times daily., Disp: , Rfl:    ESTRACE VAGINAL 0.1 MG/GM vaginal cream, 1 Applicatorful as needed. , Disp: , Rfl: 3   levothyroxine (SYNTHROID, LEVOTHROID) 88 MCG tablet, Take 88 mcg by mouth daily., Disp: , Rfl: 12   Methylcellulose, Laxative, (CITRUCEL) 500 MG TABS, Take 2 tablets by mouth daily., Disp: , Rfl:    phenazopyridine (PYRIDIUM) 200 MG tablet, Take 1 tablet (200 mg total) by mouth 3 (three) times daily., Disp: 6 tablet, Rfl: 0   valsartan-hydrochlorothiazide (DIOVAN-HCT) 160-12.5 MG tablet, 1 tablet, Disp: , Rfl:   PAST MEDICAL HISTORY: Past Medical History:  Diagnosis Date   CKD stage 2 due to type 2 diabetes mellitus (HCC)    Colitis 06/2015   admitted for suspected ischemic colitis   DM2 (diabetes mellitus, type 2) (HCC)    GERD (gastroesophageal reflux disease)    High blood pressure    Hyperlipemia    Hypertension, renal 2016   [name]   MS (multiple sclerosis) [facility])    Thyroid disorder     PAST SURGICAL HISTORY: Past Surgical History:  Procedure Laterality Date   COLONOSCOPY     WISDOM TOOTH EXTRACTION      FAMILY HISTORY: Family History  Problem Relation Age of Onset   Ovarian cancer Mother    Lung cancer Father    Cancer Maternal Grandmother    Colon cancer Neg Hx    Multiple sclerosis Neg Hx     SOCIAL HISTORY:  Social History   Socioeconomic History   Marital status: Married    Spouse name: Christina Carey   Number of children: 2   Years of education: 16   Highest education level: Not on file  Occupational  History   Occupation: ACCOUNTING    Employer: [employer]  Tobacco Use   Smoking status: Never   Smokeless tobacco: Never  Vaping Use   Vaping status: Never Used  Substance and Sexual Activity   Alcohol use: No    Comment: caffeine drinks 1-2 daily   Drug use: No   Sexual activity: Not Currently  Other Topics Concern   Not on file  Social History Narrative   Patient lives at home with her husband Dereck Leep. and they have 2 adult children. 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    Patient is right- handed.   Patient drinks one cup of coffee daily and soda very rarely.   Social Drivers of Corporate investment banker Strain: Not on file  Food Insecurity: Not on file  Transportation Needs: Not on file  Physical Activity: Not on file  Stress: Not on file  Social Connections: Not on file  Intimate Partner Violence: Not on file     PHYSICAL EXAM  Vitals:   08/14/23 0820  BP: 120/67  Pulse: 61  Weight: 189 lb (85.7 kg)  Height: 5\' 7"  (1.702 m)     Body mass index is 29.6 kg/m.   General: The patient is well-developed and well-nourished and in no acute distress  HEENT:  Head is [place]/AT.  Sclera are anicteric.     Skin: Extremities are without rash or  edema.  Musculoskeletal:  Back is nontender  Neurologic Exam  Mental status: The patient is alert and oriented x 3 at the time of the examination. The patient has apparent normal recent and remote memory, with an apparently normal attention span and concentration ability.   Speech is normal.  Cranial nerves: Extraocular movements are full.   Facial symmetry is present. There is good facial sensation to soft touch bilaterally.Facial strength is normal.  Trapezius and sternocleidomastoid strength is normal. No dysarthria is noted.  . No obvious hearing deficits are noted.  Motor:  Muscle bulk is normal.   Tone is normal. Strength is  5 / 5 in all 4 extremities.   Sensory: Sensory testing is  intact to pinprick, soft touch and vibration sensation in all 4 extremities.  Coordination: Cerebellar testing reveals good finger-nose-finger and heel-to-shin bilaterally.  Gait and station: Station is normal.   Gait is normal. Tandem gait is mildly wide. Romberg is negative.   Reflexes: Deep tendon reflexes are symmetric and normal in arms and 3+ at knees.       DIAGNOSTIC DATA (LABS, IMAGING, TESTING) - I reviewed patient records, labs, notes, testing and imaging myself where available.  Lab Results  Component Value Date   WBC 3.2 (L) 10/20/2020   HGB 13.8 10/20/2020   HCT 41.0 10/20/2020   MCV 89 10/20/2020   PLT 262 10/20/2020      Component Value Date/Time   NA 142 10/20/2020 0833   K 3.8 10/20/2020 0833   CL 104 10/20/2020 0833   CO2 25 10/20/2020 0833   GLUCOSE 114 (H) 10/20/2020 0833   GLUCOSE 97 06/26/2015 1538   BUN 13 10/20/2020 0833   CREATININE 0.86 10/20/2020 0833   CALCIUM 9.6 10/20/2020 0833   PROT 6.4 10/20/2020 0833   ALBUMIN 4.1 10/20/2020 0833   AST 22 10/20/2020 0833   ALT 23 10/20/2020 0833   ALKPHOS 79 10/20/2020 0833   BILITOT 0.2 10/20/2020 0833   GFRNONAA 76 01/01/2019 0833   GFRAA 87 01/01/2019 0833       ASSESSMENT AND PLAN  MULTIPLE SCLEROSIS - Plan: MR BRAIN W WO CONTRAST  High risk medication use  Gait disturbance - Plan: MR BRAIN W WO CONTRAST  Urinary urgency - Plan: MR BRAIN W WO CONTRAST   Will remain off of a disease modifying therapy, having stopped Gilenya in 2023.  MRI of the brain in April 2024 showed stability. We will check another MRI of brain to determine if there is any progression off a DMT.  If this is occurring we will need to reconsider starting a disease modifying therapy.  Due to the added risks of S1 P receptor modulators with age, I would consider Aubagio or Mavenclad if we need to get back on a medication If more UTIs consider adding tamsulosin Rtc 1 year  This visit is part of a comprehensive  longitudinal care medical relationship regarding the patients primary diagnosis of MS and related concerns.      Deauna Yaw A. Epimenio Foot, MD, [facility] 08/14/2023, 9:05 AM Certified in Neurology, Clinical Neurophysiology, Sleep Medicine and Neuroimaging  [facility] [address], [address] [place], [place] [address] [phone]

## 2023-08-20 ENCOUNTER — Telehealth: Payer: Self-pay | Admitting: Neurology

## 2023-08-20 NOTE — Telephone Encounter (Signed)
 [employer] medicare Siegfried Dress: [id number] exp. 08/20/23-09/18/23 sent to GI [phone]

## 2023-09-04 DIAGNOSIS — K080 Exfoliation of teeth due to systemic causes: Secondary | ICD-10-CM | POA: Diagnosis not present

## 2023-09-16 ENCOUNTER — Ambulatory Visit
Admission: RE | Admit: 2023-09-16 | Discharge: 2023-09-16 | Disposition: A | Discharge status: Home / Self Care | Source: Ambulatory Visit | Attending: Neurology | Admitting: Neurology

## 2023-09-16 DIAGNOSIS — R3915 Urgency of urination: Secondary | ICD-10-CM

## 2023-09-16 DIAGNOSIS — G35 Multiple sclerosis: Secondary | ICD-10-CM | POA: Diagnosis not present

## 2023-09-16 DIAGNOSIS — R269 Unspecified abnormalities of gait and mobility: Secondary | ICD-10-CM

## 2023-09-16 MED ORDER — GADOPICLENOL 0.5 MMOL/ML IV SOLN
8.0000 mL | Freq: Once | INTRAVENOUS | Status: AC | PRN
  Administered 2023-09-16: 8 mL via INTRAVENOUS

## 2023-09-17 ENCOUNTER — Ambulatory Visit: Payer: Self-pay | Admitting: Neurology

## 2023-09-17 DIAGNOSIS — N3020 Other chronic cystitis without hematuria: Secondary | ICD-10-CM | POA: Diagnosis not present

## 2023-09-17 DIAGNOSIS — R350 Frequency of micturition: Secondary | ICD-10-CM | POA: Diagnosis not present

## 2023-09-19 DIAGNOSIS — K080 Exfoliation of teeth due to systemic causes: Secondary | ICD-10-CM | POA: Diagnosis not present

## 2023-10-14 ENCOUNTER — Ambulatory Visit (INDEPENDENT_AMBULATORY_CARE_PROVIDER_SITE_OTHER): Admitting: Radiology

## 2023-10-14 ENCOUNTER — Other Ambulatory Visit: Payer: Self-pay

## 2023-10-14 ENCOUNTER — Ambulatory Visit
Admission: RE | Admit: 2023-10-14 | Discharge: 2023-10-14 | Disposition: A | Discharge status: Home / Self Care | Source: Ambulatory Visit | Attending: Physician Assistant | Admitting: Physician Assistant

## 2023-10-14 VITALS — BP 132/83 | HR 69 | Temp 97.7°F | Resp 18 | Ht 67.0 in | Wt 183.0 lb

## 2023-10-14 DIAGNOSIS — R0689 Other abnormalities of breathing: Secondary | ICD-10-CM | POA: Diagnosis not present

## 2023-10-14 DIAGNOSIS — R058 Other specified cough: Secondary | ICD-10-CM | POA: Diagnosis not present

## 2023-10-14 DIAGNOSIS — J069 Acute upper respiratory infection, unspecified: Secondary | ICD-10-CM | POA: Diagnosis not present

## 2023-10-14 DIAGNOSIS — R0602 Shortness of breath: Secondary | ICD-10-CM

## 2023-10-14 MED ORDER — AZITHROMYCIN 250 MG PO TABS: ORAL_TABLET | ORAL | 0 refills | Status: AC

## 2023-10-14 NOTE — Discharge Instructions (Signed)
 Your xray was negative for signs of pneumonia as noted by radiology. At this time I suspect you are likely having an upper respiratory tract infection which may be viral but it is difficult to say.  I am sending you home with a prescription for azithromycin which is also known as a Z-Pak.  Please take this as directed for 5 days.  This medication should help reduce pulmonary inflammation so that should help you breathe a little bit easier as well as provide antibiotic coverage. I recommend continuing to use over-the-counter medications as needed for further symptomatic relief. If you feel like your symptoms are not improving or seem to be worsening over the next 5 days I recommend following up in urgent care or with your PCP for further evaluation and ongoing management.  Should you start to develop symptoms such as fever or chills are not responding to medications, difficulty breathing, passing out, confusion please go to the emergency room as these could be signs of a medical emergency.

## 2023-10-14 NOTE — ED Triage Notes (Signed)
 Pt presents with complaints of dry, deep cough x 1 week. Pt states she has been having green/yellow mucus come out of nose. Sinus pressure when symptoms first began however this has now resolved. OTC cold medication taken 2-3 times per day with some improvement in symptoms. Zyrtec taken daily.

## 2023-10-14 NOTE — ED Provider Notes (Signed)
 [facility]    CSN: [id number] Arrival date & time: 10/14/23  1053      History   Chief Complaint Chief Complaint  Patient presents with   Cough    I have had cold symptoms for about a week.  My cough sometimes sounds sort of deep so I just want to make sure I do not have a problem with my lungs or bronchial tubes. - Entered by patient    HPI Christina Carey is a 68 y.o. female.   HPI  Pt reports concerns for sinus congestion, chest congestion and intermittently productive cough with thick green sputum She states her symptoms have been ongoing for about a week but seemed to get worse over the last 3-4 days She reports her husband has been sick with similar symptoms as well Interventions: OTC Coricidin,   Past Medical History:  Diagnosis Date   CKD stage 2 due to type 2 diabetes mellitus (HCC)    Colitis 06/2015   admitted for suspected ischemic colitis   DM2 (diabetes mellitus, type 2) (HCC)    GERD (gastroesophageal reflux disease)    High blood pressure    Hyperlipemia    Hypertension, renal 2016   Imelda Man   MS (multiple sclerosis) [facility])    Thyroid  disorder     Patient Active Problem List   Diagnosis Date Noted   Urinary urgency 08/03/2021   Gait disturbance 08/03/2021   Ischemic colitis (HCC) 01/02/2018   IBS (irritable bowel syndrome) 05/02/2017   Fecal incontinence 05/02/2017   Colitis 06/27/2015   Encounter for therapeutic drug monitoring 01/05/2015   MULTIPLE SCLEROSIS 05/26/2008   HYPERTENSION 05/26/2008   HIATAL HERNIA 05/26/2008   CONSTIPATION, CHRONIC 05/26/2008   HEMOCCULT POSITIVE STOOL 05/26/2008   CHEST PAIN 05/26/2008   ABDOMINAL PAIN, UNSPECIFIED SITE 05/26/2008    Past Surgical History:  Procedure Laterality Date   COLONOSCOPY     WISDOM TOOTH EXTRACTION      OB History   No obstetric history on file.      Home Medications    Prior to Admission medications   Medication Sig Start Date End Date Taking?  Authorizing Provider  azithromycin (ZITHROMAX) 250 MG tablet Take 500mg  PO daily x1d and then 250mg  daily x4 days 10/14/23  Yes Syerra Abdelrahman E, PA-C  Calcium Carbonate-Vitamin D (CALCIUM + D PO) Take 1,000 mg by mouth daily.     [provider]  CINNAMON PO Take 1,000 mg by mouth 2 (two) times daily.    [provider]  ESTRACE VAGINAL 0.1 MG/GM vaginal cream 1 Applicatorful as needed.  06/20/15   [provider]  levothyroxine  (SYNTHROID , LEVOTHROID) 88 MCG tablet Take 88 mcg by mouth daily. 04/06/17   [provider]  Methylcellulose, Laxative, (CITRUCEL) 500 MG TABS Take 2 tablets by mouth daily.    [provider]  phenazopyridine  (PYRIDIUM ) 200 MG tablet Take 1 tablet (200 mg total) by mouth 3 (three) times daily. 07/22/23   Acevedo, Angela, PA  valsartan-hydrochlorothiazide (DIOVAN-HCT) 160-12.5 MG tablet 1 tablet 08/11/20   [provider]    Family History Family History  Problem Relation Age of Onset   Ovarian cancer Mother    Lung cancer Father    Cancer Maternal Grandmother    Colon cancer Neg Hx    Multiple sclerosis Neg Hx     Social History Social History   Tobacco Use   Smoking status: Never   Smokeless tobacco: Never  Vaping Use  Vaping status: Never Used  Substance Use Topics   Alcohol use: No    Comment: caffeine drinks 1-2 daily   Drug use: No     Allergies   Amlodipine  and Sulfamethoxazole-trimethoprim   Review of Systems Review of Systems  Constitutional:  Positive for fatigue. Negative for chills and fever.  HENT:  Positive for congestion, rhinorrhea, sinus pressure, sinus pain and sneezing. Negative for ear pain and sore throat.   Respiratory:  Positive for cough. Negative for shortness of breath and wheezing.   Gastrointestinal:  Negative for diarrhea, nausea and vomiting.  Musculoskeletal:  Negative for myalgias.     Physical Exam Triage Vital Signs ED Triage Vitals  Encounter Vitals Group      BP 10/14/23 1100 132/83     Systolic BP Percentile --      Diastolic BP Percentile --      Pulse Rate 10/14/23 1100 69     Resp 10/14/23 1100 18     Temp 10/14/23 1100 97.7 F (36.5 C)     Temp Source 10/14/23 1100 Oral     SpO2 10/14/23 1100 97 %     Weight 10/14/23 1100 183 lb (83 kg)     Height 10/14/23 1100 5\' 7"  (1.702 m)     Head Circumference --      Peak Flow --      Pain Score 10/14/23 1117 0     Pain Loc --      Pain Education --      Exclude from Growth Chart --    No data found.  Updated Vital Signs BP 132/83 (BP Location: Right Arm)   Pulse 69   Temp 97.7 F (36.5 C) (Oral)   Resp 18   Ht 5\' 7"  (1.702 m)   Wt 183 lb (83 kg)   SpO2 97%   BMI 28.66 kg/m   Visual Acuity Right Eye Distance:   Left Eye Distance:   Bilateral Distance:    Right Eye Near:   Left Eye Near:    Bilateral Near:     Physical Exam Vitals reviewed.  Constitutional:      General: She is awake. She is not in acute distress.    Appearance: Normal appearance. She is well-developed and well-groomed. She is not ill-appearing or toxic-appearing.  HENT:     Head: Normocephalic and atraumatic.     Right Ear: Hearing, tympanic membrane and ear canal normal.     Left Ear: Hearing, tympanic membrane and ear canal normal.     Mouth/Throat:     Lips: Pink.     Mouth: Mucous membranes are moist.     Pharynx: Oropharynx is clear. Uvula midline. No pharyngeal swelling, oropharyngeal exudate, posterior oropharyngeal erythema, uvula swelling or postnasal drip.  Cardiovascular:     Rate and Rhythm: Normal rate and regular rhythm.     Pulses: Normal pulses.          Radial pulses are 2+ on the right side and 2+ on the left side.     Heart sounds: Normal heart sounds. No murmur heard.    No friction rub. No gallop.  Pulmonary:     Effort: Pulmonary effort is normal.     Breath sounds: Decreased air movement present. Examination of the right-middle field reveals decreased breath sounds.  Examination of the right-lower field reveals decreased breath sounds. Examination of the left-lower field reveals decreased breath sounds. Decreased breath sounds present. No wheezing, rhonchi or rales.  Musculoskeletal:  Cervical back: Normal range of motion and neck supple.  Lymphadenopathy:     Head:     Right side of head: No submental, submandibular or preauricular adenopathy.     Left side of head: No submental, submandibular or preauricular adenopathy.     Cervical:     Right cervical: No superficial cervical adenopathy.    Left cervical: No superficial cervical adenopathy.     Upper Body:     Right upper body: No supraclavicular adenopathy.     Left upper body: No supraclavicular adenopathy.  Skin:    General: Skin is warm and dry.  Neurological:     General: No focal deficit present.     Mental Status: She is alert and oriented to person, place, and time.  Psychiatric:        Mood and Affect: Mood normal.        Behavior: Behavior normal. Behavior is cooperative.      UC Treatments / Results  Labs (all labs ordered are listed, but only abnormal results are displayed) Labs Reviewed - No data to display  EKG   Radiology DG Chest 2 View Result Date: 10/14/2023 CLINICAL DATA:  decreased air movement in lower lobes. EXAM: CHEST - 2 VIEW COMPARISON:  None Available. FINDINGS: Bilateral lung fields are clear. Bilateral costophrenic angles are clear. Normal cardio-mediastinal silhouette. No acute osseous abnormalities. The soft tissues are within normal limits. IMPRESSION: No active cardiopulmonary disease. Electronically Signed   By: Beula Brunswick M.D.   On: 10/14/2023 12:04    Procedures Procedures (including critical care time)  Medications Ordered in UC Medications - No data to display  Initial Impression / Assessment and Plan / UC Course  I have reviewed the triage vital signs and the nursing notes.  Pertinent labs & imaging results that were available during  my care of the patient were reviewed by me and considered in my medical decision making (see chart for details).      Final Clinical Impressions(s) / UC Diagnoses   Final diagnoses:  SOB (shortness of breath)  Productive cough  Upper respiratory tract infection, unspecified type   Patient presents today with concerns for dry cough as well as rhinorrhea and sinus pressure that has been ongoing for about a week.  She reports minimal improvement with OTC medications and states that her husband has been sick with similar symptoms.  Physical exam is notable for decreased air movement in the right middle and lower lung fields.  Chest x-ray was negative for signs of acute cardiopulmonary disease per radiology review.  Given symptoms and lack of improvement for over a week we will send in Z-Pak to assist with pulmonary inflammation and provide antibiotic coverage.  Recommend continued use of over-the-counter medications as needed for further symptomatic relief.  ED and return precautions reviewed and provided in after visit summary.  Follow-up as needed.    Discharge Instructions      Your xray was negative for signs of pneumonia as noted by radiology. At this time I suspect you are likely having an upper respiratory tract infection which may be viral but it is difficult to say.  I am sending you home with a prescription for azithromycin which is also known as a Z-Pak.  Please take this as directed for 5 days.  This medication should help reduce pulmonary inflammation so that should help you breathe a little bit easier as well as provide antibiotic coverage. I recommend continuing to use over-the-counter medications as needed  for further symptomatic relief. If you feel like your symptoms are not improving or seem to be worsening over the next 5 days I recommend following up in urgent care or with your PCP for further evaluation and ongoing management.  Should you start to develop symptoms such as fever  or chills are not responding to medications, difficulty breathing, passing out, confusion please go to the emergency room as these could be signs of a medical emergency.   ED Prescriptions     Medication Sig Dispense Auth. Provider   azithromycin (ZITHROMAX) 250 MG tablet Take 500mg  PO daily x1d and then 250mg  daily x4 days 6 each Danille Oppedisano E, PA-C      PDMP not reviewed this encounter.   Garlon Tuggle E, PA-C 10/14/23 [id number]

## 2023-10-15 DIAGNOSIS — K080 Exfoliation of teeth due to systemic causes: Secondary | ICD-10-CM | POA: Diagnosis not present

## 2023-11-22 ENCOUNTER — Encounter: Payer: Self-pay | Admitting: Advanced Practice Midwife

## 2023-12-12 DIAGNOSIS — Z1231 Encounter for screening mammogram for malignant neoplasm of breast: Secondary | ICD-10-CM | POA: Diagnosis not present

## 2023-12-17 DIAGNOSIS — R350 Frequency of micturition: Secondary | ICD-10-CM | POA: Diagnosis not present

## 2023-12-17 DIAGNOSIS — N390 Urinary tract infection, site not specified: Secondary | ICD-10-CM | POA: Diagnosis not present

## 2023-12-31 DIAGNOSIS — E7800 Pure hypercholesterolemia, unspecified: Secondary | ICD-10-CM | POA: Diagnosis not present

## 2023-12-31 DIAGNOSIS — E039 Hypothyroidism, unspecified: Secondary | ICD-10-CM | POA: Diagnosis not present

## 2023-12-31 DIAGNOSIS — Z79899 Other long term (current) drug therapy: Secondary | ICD-10-CM | POA: Diagnosis not present

## 2023-12-31 DIAGNOSIS — R7309 Other abnormal glucose: Secondary | ICD-10-CM | POA: Diagnosis not present

## 2023-12-31 DIAGNOSIS — E559 Vitamin D deficiency, unspecified: Secondary | ICD-10-CM | POA: Diagnosis not present

## 2024-01-07 DIAGNOSIS — Z0000 Encounter for general adult medical examination without abnormal findings: Secondary | ICD-10-CM | POA: Diagnosis not present

## 2024-01-07 DIAGNOSIS — E039 Hypothyroidism, unspecified: Secondary | ICD-10-CM | POA: Diagnosis not present

## 2024-01-07 DIAGNOSIS — G35 Multiple sclerosis: Secondary | ICD-10-CM | POA: Diagnosis not present

## 2024-01-07 DIAGNOSIS — E7800 Pure hypercholesterolemia, unspecified: Secondary | ICD-10-CM | POA: Diagnosis not present

## 2024-01-21 DIAGNOSIS — N3020 Other chronic cystitis without hematuria: Secondary | ICD-10-CM | POA: Diagnosis not present

## 2024-01-21 DIAGNOSIS — R350 Frequency of micturition: Secondary | ICD-10-CM | POA: Diagnosis not present

## 2024-02-21 DIAGNOSIS — N3020 Other chronic cystitis without hematuria: Secondary | ICD-10-CM | POA: Diagnosis not present

## 2024-03-02 DIAGNOSIS — K8020 Calculus of gallbladder without cholecystitis without obstruction: Secondary | ICD-10-CM | POA: Diagnosis not present

## 2024-03-02 DIAGNOSIS — N3020 Other chronic cystitis without hematuria: Secondary | ICD-10-CM | POA: Diagnosis not present

## 2024-03-02 DIAGNOSIS — R399 Unspecified symptoms and signs involving the genitourinary system: Secondary | ICD-10-CM | POA: Diagnosis not present

## 2024-03-17 DIAGNOSIS — N3020 Other chronic cystitis without hematuria: Secondary | ICD-10-CM | POA: Diagnosis not present

## 2024-03-25 DIAGNOSIS — N390 Urinary tract infection, site not specified: Secondary | ICD-10-CM | POA: Diagnosis not present

## 2024-03-25 DIAGNOSIS — R350 Frequency of micturition: Secondary | ICD-10-CM | POA: Diagnosis not present

## 2024-03-25 DIAGNOSIS — R351 Nocturia: Secondary | ICD-10-CM | POA: Diagnosis not present

## 2024-08-13 ENCOUNTER — Ambulatory Visit: Admitting: Neurology
